# Patient Record
Sex: Male | Born: 1940 | Race: White | Hispanic: No | Marital: Married | Smoking: Former smoker
Health system: Southern US, Community
[De-identification: ages and names within clinical notes are randomized; demographics above are authoritative.]

## PROBLEM LIST (undated history)

## (undated) DIAGNOSIS — J189 Pneumonia, unspecified organism: Secondary | ICD-10-CM

## (undated) DIAGNOSIS — I071 Rheumatic tricuspid insufficiency: Secondary | ICD-10-CM

## (undated) DIAGNOSIS — M109 Gout, unspecified: Secondary | ICD-10-CM

## (undated) DIAGNOSIS — I272 Pulmonary hypertension, unspecified: Secondary | ICD-10-CM

## (undated) DIAGNOSIS — J9621 Acute and chronic respiratory failure with hypoxia: Secondary | ICD-10-CM

## (undated) DIAGNOSIS — I10 Essential (primary) hypertension: Secondary | ICD-10-CM

## (undated) DIAGNOSIS — J84112 Idiopathic pulmonary fibrosis: Secondary | ICD-10-CM

## (undated) DIAGNOSIS — I5043 Acute on chronic combined systolic (congestive) and diastolic (congestive) heart failure: Secondary | ICD-10-CM

## (undated) HISTORY — PX: LUNG LOBECTOMY: SHX167

---

## 2017-08-19 ENCOUNTER — Inpatient Hospital Stay
Admission: RE | Admit: 2017-08-19 | Discharge: 2017-10-05 | Disposition: E | Payer: Medicare Other | Attending: Internal Medicine | Admitting: Internal Medicine

## 2017-08-19 ENCOUNTER — Inpatient Hospital Stay: Admission: RE | Admit: 2017-08-19 | Payer: Self-pay | Source: Home / Self Care | Admitting: Internal Medicine

## 2017-08-19 DIAGNOSIS — J9621 Acute and chronic respiratory failure with hypoxia: Secondary | ICD-10-CM | POA: Diagnosis present

## 2017-08-19 DIAGNOSIS — R1310 Dysphagia, unspecified: Secondary | ICD-10-CM

## 2017-08-19 DIAGNOSIS — R0603 Acute respiratory distress: Secondary | ICD-10-CM

## 2017-08-19 DIAGNOSIS — J9690 Respiratory failure, unspecified, unspecified whether with hypoxia or hypercapnia: Secondary | ICD-10-CM

## 2017-08-19 DIAGNOSIS — J84112 Idiopathic pulmonary fibrosis: Secondary | ICD-10-CM | POA: Diagnosis present

## 2017-08-19 DIAGNOSIS — K222 Esophageal obstruction: Secondary | ICD-10-CM

## 2017-08-19 DIAGNOSIS — I5043 Acute on chronic combined systolic (congestive) and diastolic (congestive) heart failure: Secondary | ICD-10-CM

## 2017-08-19 DIAGNOSIS — J189 Pneumonia, unspecified organism: Secondary | ICD-10-CM | POA: Diagnosis present

## 2017-08-19 DIAGNOSIS — I509 Heart failure, unspecified: Secondary | ICD-10-CM

## 2017-08-19 DIAGNOSIS — I272 Pulmonary hypertension, unspecified: Secondary | ICD-10-CM | POA: Diagnosis present

## 2017-08-19 HISTORY — DX: Gout, unspecified: M10.9

## 2017-08-19 HISTORY — DX: Pulmonary hypertension, unspecified: I27.20

## 2017-08-19 HISTORY — DX: Essential (primary) hypertension: I10

## 2017-08-19 HISTORY — DX: Idiopathic pulmonary fibrosis: J84.112

## 2017-08-19 HISTORY — DX: Rheumatic tricuspid insufficiency: I07.1

## 2017-08-19 HISTORY — DX: Acute and chronic respiratory failure with hypoxia: J96.21

## 2017-08-19 HISTORY — DX: Pneumonia, unspecified organism: J18.9

## 2017-08-19 HISTORY — DX: Acute on chronic combined systolic (congestive) and diastolic (congestive) heart failure: I50.43

## 2017-08-20 LAB — CBC
HEMATOCRIT: 32.9 % — AB (ref 39.0–52.0)
HEMOGLOBIN: 10.3 g/dL — AB (ref 13.0–17.0)
MCH: 24.1 pg — AB (ref 26.0–34.0)
MCHC: 31.3 g/dL (ref 30.0–36.0)
MCV: 77 fL — ABNORMAL LOW (ref 78.0–100.0)
Platelets: 455 10*3/uL — ABNORMAL HIGH (ref 150–400)
RBC: 4.27 MIL/uL (ref 4.22–5.81)
RDW: 14.6 % (ref 11.5–15.5)
WBC: 13.4 10*3/uL — ABNORMAL HIGH (ref 4.0–10.5)

## 2017-08-20 LAB — BASIC METABOLIC PANEL
ANION GAP: 14 (ref 5–15)
BUN: 39 mg/dL — AB (ref 6–20)
CO2: 30 mmol/L (ref 22–32)
Calcium: 8 mg/dL — ABNORMAL LOW (ref 8.9–10.3)
Chloride: 91 mmol/L — ABNORMAL LOW (ref 101–111)
Creatinine, Ser: 1.24 mg/dL (ref 0.61–1.24)
GFR calc Af Amer: >60 mL/min (ref >60)
GFR calc non Af Amer: 54 mL/min — ABNORMAL LOW (ref >60)
GLUCOSE: 322 mg/dL — AB (ref 65–99)
Potassium: 3.4 mmol/L — ABNORMAL LOW (ref 3.5–5.1)
Sodium: 135 mmol/L (ref 135–145)

## 2017-08-20 MED ORDER — DICLOFENAC SODIUM 1 % TD GEL
TRANSDERMAL | Status: DC
Start: ? — End: 2017-08-20

## 2017-08-20 MED ORDER — PRAVASTATIN SODIUM 40 MG PO TABS
40.00 | ORAL_TABLET | ORAL | Status: DC
Start: 2017-08-20 — End: 2017-08-20

## 2017-08-20 MED ORDER — DOXAZOSIN MESYLATE 1 MG PO TABS
1.00 | ORAL_TABLET | ORAL | Status: DC
Start: 2017-08-20 — End: 2017-08-20

## 2017-08-20 MED ORDER — DOCUSATE SODIUM 100 MG PO CAPS
100.00 | ORAL_CAPSULE | ORAL | Status: DC
Start: 2017-08-19 — End: 2017-08-20

## 2017-08-20 MED ORDER — SODIUM CHLORIDE 0.9 % IV SOLN
10.00 | INTRAVENOUS | Status: DC
Start: ? — End: 2017-08-20

## 2017-08-20 MED ORDER — TAMSULOSIN HCL 0.4 MG PO CAPS
.40 | ORAL_CAPSULE | ORAL | Status: DC
Start: 2017-08-19 — End: 2017-08-20

## 2017-08-20 MED ORDER — INSULIN GLARGINE 100 UNIT/ML ~~LOC~~ SOLN
1.00 | SUBCUTANEOUS | Status: DC
Start: 2017-08-19 — End: 2017-08-20

## 2017-08-20 MED ORDER — METHYLPREDNISOLONE SODIUM SUCC 125 MG IJ SOLR
60.00 | INTRAMUSCULAR | Status: DC
Start: 2017-08-20 — End: 2017-08-20

## 2017-08-20 MED ORDER — GENERIC EXTERNAL MEDICATION
Status: DC
Start: ? — End: 2017-08-20

## 2017-08-20 MED ORDER — ALPRAZOLAM 0.25 MG PO TABS
.25 | ORAL_TABLET | ORAL | Status: DC
Start: ? — End: 2017-08-20

## 2017-08-20 MED ORDER — MAGNESIUM OXIDE 400 MG PO TABS
400.00 | ORAL_TABLET | ORAL | Status: DC
Start: 2017-08-20 — End: 2017-08-20

## 2017-08-20 MED ORDER — MELATONIN 1 MG PO TABS
2.00 | ORAL_TABLET | ORAL | Status: DC
Start: 2017-08-19 — End: 2017-08-20

## 2017-08-20 MED ORDER — ROPINIROLE HCL 0.25 MG PO TABS
0.50 | ORAL_TABLET | ORAL | Status: DC
Start: 2017-08-19 — End: 2017-08-20

## 2017-08-20 MED ORDER — IPRATROPIUM-ALBUTEROL 0.5-2.5 (3) MG/3ML IN SOLN
3.00 | RESPIRATORY_TRACT | Status: DC
Start: 2017-08-19 — End: 2017-08-20

## 2017-08-20 MED ORDER — INSULIN LISPRO 100 UNIT/ML ~~LOC~~ SOLN
1.00 | SUBCUTANEOUS | Status: DC
Start: 2017-08-19 — End: 2017-08-20

## 2017-08-20 MED ORDER — FUROSEMIDE 10 MG/ML IJ SOLN
40.00 | INTRAMUSCULAR | Status: DC
Start: 2017-08-20 — End: 2017-08-20

## 2017-08-20 MED ORDER — ACETAMINOPHEN 325 MG PO TABS
650.00 | ORAL_TABLET | ORAL | Status: DC
Start: ? — End: 2017-08-20

## 2017-08-20 MED ORDER — DILTIAZEM HCL 60 MG PO TABS
30.00 | ORAL_TABLET | ORAL | Status: DC
Start: 2017-08-19 — End: 2017-08-20

## 2017-08-20 MED ORDER — POTASSIUM CHLORIDE 20 MEQ/15ML (10%) PO SOLN
20.00 | ORAL | Status: DC
Start: ? — End: 2017-08-20

## 2017-08-20 MED ORDER — FLUDROCORTISONE ACETATE 0.1 MG PO TABS
.20 | ORAL_TABLET | ORAL | Status: DC
Start: 2017-08-20 — End: 2017-08-20

## 2017-08-20 MED ORDER — INSULIN LISPRO 100 UNIT/ML ~~LOC~~ SOLN
1.00 | SUBCUTANEOUS | Status: DC
Start: ? — End: 2017-08-20

## 2017-08-20 MED ORDER — FINASTERIDE 5 MG PO TABS
5.00 | ORAL_TABLET | ORAL | Status: DC
Start: 2017-08-20 — End: 2017-08-20

## 2017-08-20 MED ORDER — HYDRALAZINE HCL 20 MG/ML IJ SOLN
10.00 | INTRAMUSCULAR | Status: DC
Start: ? — End: 2017-08-20

## 2017-08-20 MED ORDER — HYDROCODONE-ACETAMINOPHEN 7.5-325 MG PO TABS
1.00 | ORAL_TABLET | ORAL | Status: DC
Start: ? — End: 2017-08-20

## 2017-08-20 MED ORDER — POTASSIUM CHLORIDE 20 MEQ/50ML IV SOLN
20.00 | INTRAVENOUS | Status: DC
Start: ? — End: 2017-08-20

## 2017-08-20 MED ORDER — COLCHICINE 0.6 MG PO TABS
.60 | ORAL_TABLET | ORAL | Status: DC
Start: 2017-08-20 — End: 2017-08-20

## 2017-08-20 MED ORDER — ASPIRIN EC 81 MG PO TBEC
81.00 | DELAYED_RELEASE_TABLET | ORAL | Status: DC
Start: 2017-08-20 — End: 2017-08-20

## 2017-08-20 MED ORDER — POTASSIUM CHLORIDE CRYS ER 20 MEQ PO TBCR
20.00 | EXTENDED_RELEASE_TABLET | ORAL | Status: DC
Start: ? — End: 2017-08-20

## 2017-08-20 MED ORDER — HEPARIN SODIUM (PORCINE) 5000 UNIT/ML IJ SOLN
5000.00 | INTRAMUSCULAR | Status: DC
Start: 2017-08-19 — End: 2017-08-20

## 2017-08-20 MED ORDER — SERTRALINE HCL 50 MG PO TABS
25.00 | ORAL_TABLET | ORAL | Status: DC
Start: 2017-08-20 — End: 2017-08-20

## 2017-08-21 ENCOUNTER — Other Ambulatory Visit (HOSPITAL_COMMUNITY): Payer: Medicare Other

## 2017-08-21 LAB — BASIC METABOLIC PANEL
Anion gap: 12 (ref 5–15)
BUN: 41 mg/dL — AB (ref 6–20)
CO2: 33 mmol/L — ABNORMAL HIGH (ref 22–32)
Calcium: 8.4 mg/dL — ABNORMAL LOW (ref 8.9–10.3)
Chloride: 92 mmol/L — ABNORMAL LOW (ref 101–111)
Creatinine, Ser: 1.49 mg/dL — ABNORMAL HIGH (ref 0.61–1.24)
GFR calc Af Amer: 50 mL/min — ABNORMAL LOW (ref >60)
GFR, EST NON AFRICAN AMERICAN: 44 mL/min — AB (ref >60)
GLUCOSE: 102 mg/dL — AB (ref 65–99)
POTASSIUM: 3.2 mmol/L — AB (ref 3.5–5.1)
SODIUM: 137 mmol/L (ref 135–145)

## 2017-08-21 LAB — MAGNESIUM: Magnesium: 2.5 mg/dL — ABNORMAL HIGH (ref 1.7–2.4)

## 2017-08-21 LAB — HEMOGLOBIN A1C
Hgb A1c MFr Bld: 7.4 % — ABNORMAL HIGH (ref 4.8–5.6)
MEAN PLASMA GLUCOSE: 165.68 mg/dL

## 2017-08-21 MED ORDER — GENERIC EXTERNAL MEDICATION
Status: DC
Start: ? — End: 2017-08-21

## 2017-08-22 LAB — RENAL FUNCTION PANEL
Albumin: 2.7 g/dL — ABNORMAL LOW (ref 3.5–5.0)
Anion gap: 14 (ref 5–15)
BUN: 42 mg/dL — AB (ref 6–20)
CALCIUM: 8.3 mg/dL — AB (ref 8.9–10.3)
CHLORIDE: 91 mmol/L — AB (ref 101–111)
CO2: 32 mmol/L (ref 22–32)
CREATININE: 1.36 mg/dL — AB (ref 0.61–1.24)
GFR, EST AFRICAN AMERICAN: 56 mL/min — AB (ref >60)
GFR, EST NON AFRICAN AMERICAN: 49 mL/min — AB (ref >60)
Glucose, Bld: 192 mg/dL — ABNORMAL HIGH (ref 65–99)
Phosphorus: 5.3 mg/dL — ABNORMAL HIGH (ref 2.5–4.6)
Potassium: 3.6 mmol/L (ref 3.5–5.1)
Sodium: 137 mmol/L (ref 135–145)

## 2017-08-22 LAB — CBC
HEMATOCRIT: 33.6 % — AB (ref 39.0–52.0)
HEMOGLOBIN: 10.4 g/dL — AB (ref 13.0–17.0)
MCH: 24 pg — AB (ref 26.0–34.0)
MCHC: 31 g/dL (ref 30.0–36.0)
MCV: 77.6 fL — AB (ref 78.0–100.0)
Platelets: 498 10*3/uL — ABNORMAL HIGH (ref 150–400)
RBC: 4.33 MIL/uL (ref 4.22–5.81)
RDW: 15 % (ref 11.5–15.5)
WBC: 15.5 10*3/uL — ABNORMAL HIGH (ref 4.0–10.5)

## 2017-08-22 LAB — MAGNESIUM: MAGNESIUM: 2.6 mg/dL — AB (ref 1.7–2.4)

## 2017-08-24 LAB — RENAL FUNCTION PANEL
ALBUMIN: 2.5 g/dL — AB (ref 3.5–5.0)
ANION GAP: 16 — AB (ref 5–15)
BUN: 42 mg/dL — ABNORMAL HIGH (ref 6–20)
CHLORIDE: 94 mmol/L — AB (ref 101–111)
CO2: 26 mmol/L (ref 22–32)
Calcium: 7.9 mg/dL — ABNORMAL LOW (ref 8.9–10.3)
Creatinine, Ser: 1.31 mg/dL — ABNORMAL HIGH (ref 0.61–1.24)
GFR, EST AFRICAN AMERICAN: 59 mL/min — AB (ref >60)
GFR, EST NON AFRICAN AMERICAN: 51 mL/min — AB (ref >60)
Glucose, Bld: 76 mg/dL (ref 65–99)
PHOSPHORUS: 4.7 mg/dL — AB (ref 2.5–4.6)
POTASSIUM: 3.3 mmol/L — AB (ref 3.5–5.1)
Sodium: 136 mmol/L (ref 135–145)

## 2017-08-24 LAB — CBC
HEMATOCRIT: 31.4 % — AB (ref 39.0–52.0)
HEMOGLOBIN: 9.8 g/dL — AB (ref 13.0–17.0)
MCH: 24 pg — ABNORMAL LOW (ref 26.0–34.0)
MCHC: 31.2 g/dL (ref 30.0–36.0)
MCV: 76.8 fL — AB (ref 78.0–100.0)
Platelets: 372 10*3/uL (ref 150–400)
RBC: 4.09 MIL/uL — AB (ref 4.22–5.81)
RDW: 15 % (ref 11.5–15.5)
WBC: 15 10*3/uL — AB (ref 4.0–10.5)

## 2017-08-24 LAB — MAGNESIUM: Magnesium: 2.6 mg/dL — ABNORMAL HIGH (ref 1.7–2.4)

## 2017-08-25 LAB — BASIC METABOLIC PANEL
Anion gap: 12 (ref 5–15)
BUN: 37 mg/dL — ABNORMAL HIGH (ref 6–20)
CHLORIDE: 93 mmol/L — AB (ref 101–111)
CO2: 30 mmol/L (ref 22–32)
CREATININE: 1.46 mg/dL — AB (ref 0.61–1.24)
Calcium: 8.1 mg/dL — ABNORMAL LOW (ref 8.9–10.3)
GFR calc non Af Amer: 45 mL/min — ABNORMAL LOW (ref >60)
GFR, EST AFRICAN AMERICAN: 52 mL/min — AB (ref >60)
Glucose, Bld: 257 mg/dL — ABNORMAL HIGH (ref 65–99)
POTASSIUM: 3.1 mmol/L — AB (ref 3.5–5.1)
Sodium: 135 mmol/L (ref 135–145)

## 2017-08-26 LAB — BASIC METABOLIC PANEL
Anion gap: 10 (ref 5–15)
BUN: 28 mg/dL — ABNORMAL HIGH (ref 6–20)
CO2: 32 mmol/L (ref 22–32)
Calcium: 7.9 mg/dL — ABNORMAL LOW (ref 8.9–10.3)
Chloride: 97 mmol/L — ABNORMAL LOW (ref 101–111)
Creatinine, Ser: 1.08 mg/dL (ref 0.61–1.24)
GFR calc Af Amer: >60 mL/min (ref >60)
Glucose, Bld: 96 mg/dL (ref 65–99)
Potassium: 3.3 mmol/L — ABNORMAL LOW (ref 3.5–5.1)
SODIUM: 139 mmol/L (ref 135–145)

## 2017-08-27 ENCOUNTER — Encounter: Payer: Self-pay | Admitting: Internal Medicine

## 2017-08-27 ENCOUNTER — Other Ambulatory Visit (HOSPITAL_COMMUNITY): Payer: Medicare Other

## 2017-08-27 DIAGNOSIS — J84112 Idiopathic pulmonary fibrosis: Secondary | ICD-10-CM | POA: Diagnosis present

## 2017-08-27 DIAGNOSIS — I509 Heart failure, unspecified: Secondary | ICD-10-CM | POA: Diagnosis not present

## 2017-08-27 DIAGNOSIS — J189 Pneumonia, unspecified organism: Secondary | ICD-10-CM | POA: Diagnosis present

## 2017-08-27 DIAGNOSIS — J9621 Acute and chronic respiratory failure with hypoxia: Secondary | ICD-10-CM | POA: Diagnosis not present

## 2017-08-27 DIAGNOSIS — I272 Pulmonary hypertension, unspecified: Secondary | ICD-10-CM | POA: Diagnosis not present

## 2017-08-27 DIAGNOSIS — I5043 Acute on chronic combined systolic (congestive) and diastolic (congestive) heart failure: Secondary | ICD-10-CM

## 2017-08-27 HISTORY — DX: Acute on chronic combined systolic (congestive) and diastolic (congestive) heart failure: I50.43

## 2017-08-27 LAB — POTASSIUM: Potassium: 3.4 mmol/L — ABNORMAL LOW (ref 3.5–5.1)

## 2017-08-27 MED ORDER — GENERIC EXTERNAL MEDICATION
Status: DC
Start: ? — End: 2017-08-27

## 2017-08-27 NOTE — Progress Notes (Signed)
Pulmonary Critical Care Medicine [facility] [place]  PULMONARY SERVICE  Date of Service: 08/27/2017  PULMONARY CONSULT   Philip Knobel Custis Sr.  ZOX:[id number]  DOB: 1940/10/24   DOA: 08/30/2017  Referring Physician: Carron Curie, MD  HPI: Philip Migliaccio Corprew Sr. is a 77 y.o. male seen for follow up of Acute on Chronic Respiratory Failure.  Patient was admitted to the transferring facility with history of shortness of breath.  Patient came in with a prior history of hyperlipidemia strokes COPD diabetes type 2 atrial fibrillation lung cancer with increasing shortness of breath.  His hospital course was as follows.  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  Eventually her antibiotics were changed over to Zosyn by itself.  On initial evaluation patient was noted to have pleural effusions and pulmonary edema.  Patient was diuresed aggressively improvement.  His oxygen requirements however have not gone totally down.  Currently patient has been requiring flow nasal cannula about 60% FiO2 with 25 L flow.  Latest chest x-ray still shows edema versus pneumonia at this time.  The wife was present in the room states that he typically does well after Lasix is given but then the fluid re-accumulates fairly rapidly.  Review of Systems:  ROS performed and is unremarkable other than noted above.  Past Medical History:  Diagnosis Date  . Acute on chronic respiratory failure with hypoxia (HCC)   . Essential hypertension   . Gout   . Healthcare-associated pneumonia   . Idiopathic diffuse interstitial pulmonary fibrosis (HCC)   . Pulmonary hypertension (HCC)   . Tricuspid regurgitation     Past Surgical History:  Procedure Laterality Date  . LUNG LOBECTOMY      Social History:    reports that he has quit smoking. He has  never used smokeless tobacco. He reports that he drank alcohol. He reports that he has current or past drug history.  Family History: Non-Contributory to the present illness  Allergies not on file  Medications: Reviewed on Rounds  Physical Exam:  Vitals: Temperature 98.7 pulse 95 respiratory rate 13 blood pressure 111/57 saturations 97%  Ventilator Settings off the ventilator on high flow nasal cannula 60% FiO2  . General: Comfortable at this time . Eyes: Grossly normal lids, irises & conjunctiva . ENT: grossly tongue is normal . Neck: no obvious mass . Cardiovascular: S1-S2 normal no gallop or rub . Respiratory: Coarse breath sounds expansion is equal . Abdomen: Soft and nontender . Skin: no rash seen on limited exam . Musculoskeletal: not rigid . Psychiatric:unable to assess . Neurologic: no seizure no involuntary movements         Labs on Admission:  Basic Metabolic Panel: Recent Labs  Lab 08/21/17 0642 08/22/17 0651 08/24/17 0644 08/25/17 0604 08/26/17 0648 08/27/17 0516  NA 137 137 136 135 139  --   K 3.2* 3.6 3.3* 3.1* 3.3* 3.4*  CL 92* 91* 94* 93* 97*  --   CO2 33* 32 26 30 32  --   GLUCOSE 102* 192* 76 257* 96  --   BUN 41* 42* 42* 37* 28*  --   CREATININE 1.49* 1.36* 1.31* 1.46* 1.08  --   CALCIUM 8.4* 8.3* 7.9* 8.1* 7.9*  --   MG 2.5* 2.6* 2.6*  --   --   --   PHOS  --  5.3* 4.7*  --   --   --     Liver Function Tests: Recent Labs  Lab 08/22/17 0651 08/24/17 0644  ALBUMIN 2.7* 2.5*   No results for input(s): LIPASE, AMYLASE in the last 168 hours. No results for input(s): AMMONIA in the last 168 hours.  CBC: Recent Labs  Lab 08/22/17 0651 08/24/17 0644  WBC 15.5* 15.0*  HGB 10.4* 9.8*  HCT 33.6* 31.4*  MCV 77.6* 76.8*  PLT 498* 372    Cardiac Enzymes: No results for input(s): CKTOTAL, CKMB, CKMBINDEX, TROPONINI in the last 168 hours.  BNP (last 3 results) No results for input(s): BNP in the last 8760 hours.  ProBNP (last 3  results) No results for input(s): PROBNP in the last 8760 hours.   Radiological Exams on Admission: Dg Chest Port 1 View  Result Date: 08/27/2017 CLINICAL DATA:  Shortness of breath. EXAM: PORTABLE CHEST 1 VIEW COMPARISON:  Radiograph of Aug 21, 2017. FINDINGS: Stable cardiomediastinal silhouette. No pneumothorax or pleural effusion is noted. Stable bibasilar opacities are noted concerning for pneumonia or edema. Minimal pleural effusions may be present. Bony thorax is unremarkable. Right apical density is noted which may represent scarring, but possible mass cannot be excluded. Further evaluation with apical lordotic view or CT scan of the chest with contrast is recommended. Bony thorax is unremarkable. IMPRESSION: Stable bibasilar opacities are noted concerning for edema or possibly pneumonia. Minimal pleural effusions may be present. Right apical density is noted which may represent scarring or pleural thickening, but possible mass or neoplasm cannot be excluded. Further evaluation with apical lordotic view of the chest or CT scan of the chest with intravenous contrast is recommended. Electronically Signed   By: Lupita Raider, M.D.   On: 08/27/2017 10:36    Assessment/Plan Active Problems:   Acute on chronic respiratory failure with hypoxia (HCC)   Idiopathic diffuse interstitial pulmonary fibrosis (HCC)   Pulmonary hypertension (HCC)   Healthcare-associated pneumonia   Acute on chronic combined systolic and diastolic CHF (congestive heart failure) (HCC)   1. Acute on chronic respiratory failure with hypoxia on high flow nasal cannula currently is on 60% with saturations of 97%.  Suggested to respiratory therapy to try to wean the FiO2 down.  In addition I think patient will benefit from diuresis.  Suggested starting on Lasix. 2. Diffuse interstitial pulmonary fibrosis patient obviously has advanced disease we will continue with supportive care.  Pulmonary pulmonary disease not likely to  improve. 3. Pulmonary hypertension patient has significant elevation of PA pressures we will continue with supportive care 4. Healthcare associated pneumonia we will continue with present management has been treated with antibiotics. 5. Acute on chronic systolic diastolic heart failure we will diurese as mentioned already.  I have personally seen and evaluated the patient, evaluated laboratory and imaging results, formulated the assessment and plan and placed orders. The Patient requires high complexity decision making for assessment and support.  Case was discussed on Rounds with the Respiratory Therapy Staff Time Spent  Yevonne Pax, MD [facility] Pulmonary Critical Care Medicine Sleep Medicine

## 2017-08-28 DIAGNOSIS — J189 Pneumonia, unspecified organism: Secondary | ICD-10-CM | POA: Diagnosis not present

## 2017-08-28 DIAGNOSIS — I509 Heart failure, unspecified: Secondary | ICD-10-CM

## 2017-08-28 DIAGNOSIS — I5043 Acute on chronic combined systolic (congestive) and diastolic (congestive) heart failure: Secondary | ICD-10-CM | POA: Diagnosis not present

## 2017-08-28 DIAGNOSIS — J9621 Acute and chronic respiratory failure with hypoxia: Secondary | ICD-10-CM | POA: Diagnosis not present

## 2017-08-28 NOTE — Progress Notes (Signed)
Pulmonary Critical Care Medicine [facility] [place]   PULMONARY SERVICE  PROGRESS NOTE  Date of Service: 08/28/2017  Philip Reifsteck Haberland Sr.  RUE:[id number]  DOB: 01-Nov-1940   DOA: 2017/09/04  Referring Physician: Carron Curie, MD  HPI: Philip Brickell Nygaard Sr. is a 77 y.o. male seen for follow up of Acute on Chronic Respiratory Failure.  Remains on high flow nasal cannula.  As mentioned patient has had a very complicated course with pulmonary hypertension pulmonary fibrosis lumpectomy and now is on oxygen dependence with high flow rates.  Medications: Reviewed on Rounds  Physical Exam:  Vitals: Temperature 97.5 pulse 89 respiratory rate 21 blood pressure is 124/63 saturations 98%  Ventilator Settings off of the ventilator on high flow nasal cannula with 60% FiO2 25 L/min flow  . General: Comfortable at this time . Eyes: Grossly normal lids, irises & conjunctiva . ENT: grossly tongue is normal . Neck: no obvious mass . Cardiovascular: S1 S2 normal no gallop . Respiratory: Coarse rhonchi expansion is equal . Abdomen: soft . Skin: no rash seen on limited exam . Musculoskeletal: not rigid . Psychiatric:unable to assess . Neurologic: no seizure no involuntary movements         Labs on Admission:  Basic Metabolic Panel: Recent Labs  Lab 08/22/17 0651 08/24/17 0644 08/25/17 0604 08/26/17 0648 08/27/17 0516  NA 137 136 135 139  --   K 3.6 3.3* 3.1* 3.3* 3.4*  CL 91* 94* 93* 97*  --   CO2 32 26 30 32  --   GLUCOSE 192* 76 257* 96  --   BUN 42* 42* 37* 28*  --   CREATININE 1.36* 1.31* 1.46* 1.08  --   CALCIUM 8.3* 7.9* 8.1* 7.9*  --   MG 2.6* 2.6*  --   --   --   PHOS 5.3* 4.7*  --   --   --     Liver Function Tests: Recent Labs  Lab 08/22/17 0651 08/24/17 0644  ALBUMIN 2.7* 2.5*   No results for input(s): LIPASE, AMYLASE in the last 168 hours. No results for input(s): AMMONIA in the last 168 hours.  CBC: Recent Labs  Lab 08/22/17 0651 08/24/17 0644  WBC  15.5* 15.0*  HGB 10.4* 9.8*  HCT 33.6* 31.4*  MCV 77.6* 76.8*  PLT 498* 372    Cardiac Enzymes: No results for input(s): CKTOTAL, CKMB, CKMBINDEX, TROPONINI in the last 168 hours.  BNP (last 3 results) No results for input(s): BNP in the last 8760 hours.  ProBNP (last 3 results) No results for input(s): PROBNP in the last 8760 hours.  Radiological Exams on Admission: Dg Chest Port 1 View  Result Date: 08/27/2017 CLINICAL DATA:  Shortness of breath. EXAM: PORTABLE CHEST 1 VIEW COMPARISON:  Radiograph of Aug 21, 2017. FINDINGS: Stable cardiomediastinal silhouette. No pneumothorax or pleural effusion is noted. Stable bibasilar opacities are noted concerning for pneumonia or edema. Minimal pleural effusions may be present. Bony thorax is unremarkable. Right apical density is noted which may represent scarring, but possible mass cannot be excluded. Further evaluation with apical lordotic view or CT scan of the chest with contrast is recommended. Bony thorax is unremarkable. IMPRESSION: Stable bibasilar opacities are noted concerning for edema or possibly pneumonia. Minimal pleural effusions may be present. Right apical density is noted which may represent scarring or pleural thickening, but possible mass or neoplasm cannot be excluded. Further evaluation with apical lordotic view of the chest or CT scan of the chest with intravenous contrast  is recommended. Electronically Signed   By: Lupita Raider, M.D.   On: 08/27/2017 10:36    Assessment/Plan Active Problems:   Acute on chronic respiratory failure with hypoxia (HCC)   Idiopathic diffuse interstitial pulmonary fibrosis (HCC)   Pulmonary hypertension (HCC)   Healthcare-associated pneumonia   Acute on chronic combined systolic and diastolic CHF (congestive heart failure) (HCC)   1. Acute on chronic respiratory failure with hypoxia we will continue with oxygen try to wean FiO2 down if able to tolerate.  Continue pulmonary toilet secretion  management supportive care prognosis is poor 2. Idiopathic pulmonary fibrosis diffuse advanced disease we will continue with supportive care patient prognosis of poor 3. Pulmonary hypertension at baseline we will continue oxygen therapy 4. Healthcare associated pneumonia treated we will continue to follow 5. Acute on chronic combined systolic diastolic heart failure diuresis tolerated we will continue to follow   I have personally seen and evaluated the patient, evaluated laboratory and imaging results, formulated the assessment and plan and placed orders. The Patient requires high complexity decision making for assessment and support.  Case was discussed on Rounds with the Respiratory Therapy Staff  Yevonne Pax, MD [facility] Pulmonary Critical Care Medicine Sleep Medicine

## 2017-08-29 LAB — CBC
HEMATOCRIT: 32.9 % — AB (ref 39.0–52.0)
Hemoglobin: 10.1 g/dL — ABNORMAL LOW (ref 13.0–17.0)
MCH: 24.2 pg — ABNORMAL LOW (ref 26.0–34.0)
MCHC: 30.7 g/dL (ref 30.0–36.0)
MCV: 78.9 fL (ref 78.0–100.0)
Platelets: 229 10*3/uL (ref 150–400)
RBC: 4.17 MIL/uL — ABNORMAL LOW (ref 4.22–5.81)
RDW: 16.3 % — AB (ref 11.5–15.5)
WBC: 15.8 10*3/uL — AB (ref 4.0–10.5)

## 2017-08-29 LAB — BASIC METABOLIC PANEL
ANION GAP: 13 (ref 5–15)
BUN: 32 mg/dL — ABNORMAL HIGH (ref 6–20)
CALCIUM: 8.4 mg/dL — AB (ref 8.9–10.3)
CO2: 33 mmol/L — ABNORMAL HIGH (ref 22–32)
Chloride: 94 mmol/L — ABNORMAL LOW (ref 101–111)
Creatinine, Ser: 1.22 mg/dL (ref 0.61–1.24)
GFR, EST NON AFRICAN AMERICAN: 55 mL/min — AB (ref >60)
Glucose, Bld: 104 mg/dL — ABNORMAL HIGH (ref 65–99)
Potassium: 3.4 mmol/L — ABNORMAL LOW (ref 3.5–5.1)
Sodium: 140 mmol/L (ref 135–145)

## 2017-08-30 LAB — BASIC METABOLIC PANEL
Anion gap: 8 (ref 5–15)
BUN: 28 mg/dL — ABNORMAL HIGH (ref 6–20)
CALCIUM: 8 mg/dL — AB (ref 8.9–10.3)
CO2: 36 mmol/L — ABNORMAL HIGH (ref 22–32)
CREATININE: 1.09 mg/dL (ref 0.61–1.24)
Chloride: 91 mmol/L — ABNORMAL LOW (ref 101–111)
GFR calc non Af Amer: >60 mL/min (ref >60)
Glucose, Bld: 117 mg/dL — ABNORMAL HIGH (ref 65–99)
Potassium: 3.7 mmol/L (ref 3.5–5.1)
SODIUM: 135 mmol/L (ref 135–145)

## 2017-08-31 DIAGNOSIS — I509 Heart failure, unspecified: Secondary | ICD-10-CM | POA: Diagnosis not present

## 2017-08-31 DIAGNOSIS — J9621 Acute and chronic respiratory failure with hypoxia: Secondary | ICD-10-CM | POA: Diagnosis not present

## 2017-08-31 DIAGNOSIS — I5043 Acute on chronic combined systolic (congestive) and diastolic (congestive) heart failure: Secondary | ICD-10-CM | POA: Diagnosis not present

## 2017-08-31 DIAGNOSIS — J189 Pneumonia, unspecified organism: Secondary | ICD-10-CM | POA: Diagnosis not present

## 2017-08-31 NOTE — Progress Notes (Signed)
Pulmonary Critical Care Medicine [facility] [place]   PULMONARY SERVICE  PROGRESS NOTE  Date of Service: 08/31/2017  Philip Helfman Mckeon Sr.  ZOX:[id number]  DOB: [birth year]   DOA: 08/10/2017  Referring Physician: Carron Curie, MD  HPI: Philip Castell Manthei Sr. is a 77 y.o. male seen for follow up of Acute on Chronic Respiratory Failure.  Patient is on oxygen high flow nasal cannula with 50% oxygen and 20 L flow rate  Medications: Reviewed on Rounds  Physical Exam:  Vitals: Temperature 96.4 pulse 66 respiratory rate 14 blood pressure 146/70 saturations 100%  Ventilator Settings off the ventilator  . General: Comfortable at this time . Eyes: Grossly normal lids, irises & conjunctiva . ENT: grossly tongue is normal . Neck: no obvious mass . Cardiovascular: S1 S2 normal no gallop . Respiratory: No rhonchi expansion equal . Abdomen: soft . Skin: no rash seen on limited exam . Musculoskeletal: not rigid . Psychiatric:unable to assess . Neurologic: no seizure no involuntary movements         Labs on Admission:  Basic Metabolic Panel: Recent Labs  Lab 08/25/17 0604 08/26/17 0648 08/27/17 0516 08/29/17 0437 08/30/17 0515  NA 135 139  --  140 135  K 3.1* 3.3* 3.4* 3.4* 3.7  CL 93* 97*  --  94* 91*  CO2 30 32  --  33* 36*  GLUCOSE 257* 96  --  104* 117*  BUN 37* 28*  --  32* 28*  CREATININE 1.46* 1.08  --  1.22 1.09  CALCIUM 8.1* 7.9*  --  8.4* 8.0*    Liver Function Tests: No results for input(s): AST, ALT, ALKPHOS, BILITOT, PROT, ALBUMIN in the last 168 hours. No results for input(s): LIPASE, AMYLASE in the last 168 hours. No results for input(s): AMMONIA in the last 168 hours.  CBC: Recent Labs  Lab 08/29/17 0437  WBC 15.8*  HGB 10.1*  HCT 32.9*  MCV 78.9  PLT 229    Cardiac Enzymes: No results for input(s): CKTOTAL, CKMB, CKMBINDEX, TROPONINI in the last 168 hours.  BNP (last 3 results) No results for input(s): BNP in the last 8760 hours.  ProBNP  (last 3 results) No results for input(s): PROBNP in the last 8760 hours.  Radiological Exams on Admission: No results found.  Assessment/Plan Active Problems:   Acute on chronic respiratory failure with hypoxia (HCC)   Idiopathic diffuse interstitial pulmonary fibrosis (HCC)   Pulmonary hypertension (HCC)   Healthcare-associated pneumonia   Acute on chronic combined systolic and diastolic CHF (congestive heart failure) (HCC)   1. Acute on chronic respiratory failure with hypoxia we will try to continue to wean FiO2 as tolerated as previously mentioned his prognosis is quite guarded.  Patient has high oxygen requirements and diffuse interstitial lung disease along with pulmonary hypertension. 2. Idiopathic diffuse interstitial fibrosis advanced severe disease we will continue supportive care and follow continue with pulmonary toilet and monitor. 3. Pulmonary hypertension at baseline we will continue present management 4. Healthcare associated pneumonia treated with antibiotics we will continue to follow 5. Acute on chronic combined systolic diastolic heart failure at baseline we will continue supportive care   I have personally seen and evaluated the patient, evaluated laboratory and imaging results, formulated the assessment and plan and placed orders. The Patient requires high complexity decision making for assessment and support.  Case was discussed on Rounds with the Respiratory Therapy Staff  Yevonne Pax, MD [facility] Pulmonary Critical Care Medicine Sleep Medicine

## 2017-09-01 ENCOUNTER — Other Ambulatory Visit (HOSPITAL_COMMUNITY): Payer: Medicare Other

## 2017-09-01 DIAGNOSIS — I5043 Acute on chronic combined systolic (congestive) and diastolic (congestive) heart failure: Secondary | ICD-10-CM | POA: Diagnosis not present

## 2017-09-01 DIAGNOSIS — J9621 Acute and chronic respiratory failure with hypoxia: Secondary | ICD-10-CM | POA: Diagnosis not present

## 2017-09-01 DIAGNOSIS — J189 Pneumonia, unspecified organism: Secondary | ICD-10-CM | POA: Diagnosis not present

## 2017-09-01 DIAGNOSIS — I509 Heart failure, unspecified: Secondary | ICD-10-CM | POA: Diagnosis not present

## 2017-09-01 LAB — CBC
HEMATOCRIT: 31.8 % — AB (ref 39.0–52.0)
HEMOGLOBIN: 9.9 g/dL — AB (ref 13.0–17.0)
MCH: 24.6 pg — ABNORMAL LOW (ref 26.0–34.0)
MCHC: 31.1 g/dL (ref 30.0–36.0)
MCV: 78.9 fL (ref 78.0–100.0)
Platelets: 88 10*3/uL — ABNORMAL LOW (ref 150–400)
RBC: 4.03 MIL/uL — ABNORMAL LOW (ref 4.22–5.81)
RDW: 16.4 % — ABNORMAL HIGH (ref 11.5–15.5)
WBC: 13.1 10*3/uL — ABNORMAL HIGH (ref 4.0–10.5)

## 2017-09-01 LAB — RENAL FUNCTION PANEL
ALBUMIN: 2.6 g/dL — AB (ref 3.5–5.0)
ANION GAP: 10 (ref 5–15)
BUN: 26 mg/dL — ABNORMAL HIGH (ref 6–20)
CO2: 35 mmol/L — ABNORMAL HIGH (ref 22–32)
Calcium: 8.2 mg/dL — ABNORMAL LOW (ref 8.9–10.3)
Chloride: 89 mmol/L — ABNORMAL LOW (ref 101–111)
Creatinine, Ser: 1.01 mg/dL (ref 0.61–1.24)
GFR calc non Af Amer: >60 mL/min (ref >60)
GLUCOSE: 121 mg/dL — AB (ref 65–99)
PHOSPHORUS: 4.5 mg/dL (ref 2.5–4.6)
POTASSIUM: 4 mmol/L (ref 3.5–5.1)
Sodium: 134 mmol/L — ABNORMAL LOW (ref 135–145)

## 2017-09-01 LAB — MAGNESIUM: Magnesium: 2.3 mg/dL (ref 1.7–2.4)

## 2017-09-01 MED ORDER — GENERIC EXTERNAL MEDICATION
Status: DC
Start: ? — End: 2017-09-01

## 2017-09-01 NOTE — Progress Notes (Signed)
Pulmonary Critical Care Medicine [facility] [place]   PULMONARY SERVICE  PROGRESS NOTE  Date of Service: 09/01/2017  Philip Weisenburger Philip Sr.  NWG:[id number]  DOB: 1940/05/22   DOA: 08/14/2017  Referring Physician: Carron Curie, MD  HPI: Philip Hausen Sava Sr. is a 77 y.o. male seen for follow up of Acute on Chronic Respiratory Failure.  Comfortable without distress patient's been on 6 L Oxymizer  Medications: Reviewed on Rounds  Physical Exam:  Vitals: Temperature 98.0 pulse 77 respiratory rate 16 blood pressure 135/96 saturations 100%  Ventilator Settings currently off the ventilator  . General: Comfortable at this time . Eyes: Grossly normal lids, irises & conjunctiva . ENT: grossly tongue is normal . Neck: no obvious mass . Cardiovascular: S1 S2 normal no gallop . Respiratory: Good air entry no rhonchi . Abdomen: soft . Skin: no rash seen on limited exam . Musculoskeletal: not rigid . Psychiatric:unable to assess . Neurologic: no seizure no involuntary movements         Labs on Admission:  Basic Metabolic Panel: Recent Labs  Lab 08/26/17 0648 08/27/17 0516 08/29/17 0437 08/30/17 0515 09/01/17 0822  NA 139  --  140 135 134*  K 3.3* 3.4* 3.4* 3.7 4.0  CL 97*  --  94* 91* 89*  CO2 32  --  33* 36* 35*  GLUCOSE 96  --  104* 117* 121*  BUN 28*  --  32* 28* 26*  CREATININE 1.08  --  1.22 1.09 1.01  CALCIUM 7.9*  --  8.4* 8.0* 8.2*  MG  --   --   --   --  2.3  PHOS  --   --   --   --  4.5    Liver Function Tests: Recent Labs  Lab 09/01/17 0822  ALBUMIN 2.6*   No results for input(s): LIPASE, AMYLASE in the last 168 hours. No results for input(s): AMMONIA in the last 168 hours.  CBC: Recent Labs  Lab 08/29/17 0437 09/01/17 0822  WBC 15.8* 13.1*  HGB 10.1* 9.9*  HCT 32.9* 31.8*  MCV 78.9 78.9  PLT 229 88*    Cardiac Enzymes: No results for input(s): CKTOTAL, CKMB, CKMBINDEX, TROPONINI in the last 168 hours.  BNP (last 3 results) No results  for input(s): BNP in the last 8760 hours.  ProBNP (last 3 results) No results for input(s): PROBNP in the last 8760 hours.  Radiological Exams on Admission: Dg Esophagus  Result Date: 09/01/2017 CLINICAL DATA:  Dysphagia and regurgitation. EXAM: ESOPHOGRAM/BARIUM SWALLOW TECHNIQUE: Single contrast examination was performed using  thin barium. FLUOROSCOPY TIME:  Fluoroscopy Time:  1 minutes and 41 seconds Radiation Exposure Index (if provided by the fluoroscopic device): Number of Acquired Spot Images: 0 COMPARISON:  None. FINDINGS: Esophageal dysmotility with disruption of the primary peristaltic wave, frequent tertiary contractions, esophageal spasm and moderate esophageal stasis. No definite hiatal hernia or GE reflux. Persistent area of smooth strictured narrowing just above the esophageal vestibule. The 13 mm barium pill would not pass through this area. Recommend endoscopic evaluation. IMPRESSION: Area of smooth strictured narrowing just above the esophageal vestibule. The 13 mm barium pill would not pass through this area. Esophageal dysmotility. Electronically Signed   By: Rudie Meyer M.D.   On: 09/01/2017 15:48    Assessment/Plan Active Problems:   Acute on chronic respiratory failure with hypoxia (HCC)   Idiopathic diffuse interstitial pulmonary fibrosis (HCC)   Pulmonary hypertension (HCC)   Healthcare-associated pneumonia   Acute on chronic combined systolic  and diastolic CHF (congestive heart failure) (HCC)   1. Acute on chronic respiratory failure with hypoxia we will continue with present management on Oxymizer wean oxygen down as tolerated continue pulmonary toilet supportive care 2. Diffuse pulmonary fibrosis at baseline prognosis poor 3. Pulmonary hypertension we will continue present management 4. Healthcare associated pneumonia treated with antibiotics 5. Acute on chronic systolic and diastolic heart failure stable at this time   I have personally seen and evaluated  the patient, evaluated laboratory and imaging results, formulated the assessment and plan and placed orders. The Patient requires high complexity decision making for assessment and support.  Case was discussed on Rounds with the Respiratory Therapy Staff  Yevonne Pax, MD [facility] Pulmonary Critical Care Medicine Sleep Medicine

## 2017-09-02 DIAGNOSIS — J9621 Acute and chronic respiratory failure with hypoxia: Secondary | ICD-10-CM | POA: Diagnosis not present

## 2017-09-02 DIAGNOSIS — I5043 Acute on chronic combined systolic (congestive) and diastolic (congestive) heart failure: Secondary | ICD-10-CM | POA: Diagnosis not present

## 2017-09-02 DIAGNOSIS — J189 Pneumonia, unspecified organism: Secondary | ICD-10-CM | POA: Diagnosis not present

## 2017-09-02 DIAGNOSIS — I509 Heart failure, unspecified: Secondary | ICD-10-CM | POA: Diagnosis not present

## 2017-09-02 NOTE — Consult Note (Signed)
[facility] Consultation Note  Referring Provider: Dr. Sharyon Medicus ([facility]) Primary Care Physician:  No primary care provider on file.  Reason for Consultation:  dysphagia  HPI: Philip Gandolfo Stainback Sr. is a 77 y.o. male with numerous medical problems here in [facility] for rehab for his acute on chronic respiratory failure, nosocomial pneumonia on top of severe baseline pulmonary fibrosis and COPD.  Has chronic dysphagia liquids>solids for years, reports GERD, reports prior endoscopic dilatation of esophagus within the past few years.  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  No abdominal pain or blood in stool.  Patient on full liquids, but tells me he has been able to swallow food fine, so long as he takes small bites and eats slowly.   Past Medical History:  Diagnosis Date  . Acute on chronic combined systolic and diastolic CHF (congestive heart failure) (HCC) 08/27/2017  . Acute on chronic respiratory failure with hypoxia (HCC)   . Essential hypertension   . Gout   . Healthcare-associated pneumonia   . Idiopathic diffuse interstitial pulmonary fibrosis (HCC)   . Pulmonary hypertension (HCC)   . Tricuspid regurgitation     Past Surgical History:  Procedure Laterality Date  . LUNG LOBECTOMY      Prior to Admission medications   Not on File    No current facility-administered medications for this encounter.     Allergies as of 08/10/2017  . (Not on File)    Family History  Family history unknown: Yes    Social History   Socioeconomic History  . Marital status: Married    Spouse name: Not on file  . Number of children: Not on file  . Years of education: Not on file  . Highest education level: Not on file  Occupational History  . Not on file  Social Needs  . Financial resource strain:  Not on file  . Food insecurity:    Worry: Not on file    Inability: Not on file  . Transportation needs:    Medical: Not on file    Non-medical: Not on file  Tobacco Use  . Smoking status: Former Games developer  . Smokeless tobacco: Never Used  Substance and Sexual Activity  . Alcohol use: Not Currently  . Drug use: Not Currently  . Sexual activity: Not Currently  Lifestyle  . Physical activity:    Days per week: Not on file    Minutes per session: Not on file  . Stress: Not on file  Relationships  . Social connections:    Talks on phone: Not on file    Gets together: Not on file    Attends religious service: Not on file    Active member of club or organization: Not on file    Attends meetings of clubs or organizations: Not on file    Relationship status: Not on file  . Intimate partner violence:    Fear of current or ex partner: Not on file    Emotionally abused: Not on file    Physically abused: Not on file    Forced sexual activity: Not on file  Other Topics Concern  . Not on file  Social History Narrative  . Not on file    Review of Systems: As per HPI, all others negative  Physical Exam: Vital signs in last 24 hours: BP: ()/()  Arterial Line BP: ()/()  General:   Alert,  Well-developed, well-nourished, frustrated, tachypneic Head:  Normocephalic and atraumatic. Eyes:  Sclera clear, no icterus.   Conjunctiva pink. Ears:  Normal auditory acuity. Nose:  No deformity, discharge,  or lesions. Mouth:  No deformity or lesions.  Oropharynx pink & moist. Neck:  Supple; + JVD; no masses or thyromegaly. Lungs:  Poor breath sounds throughout; bibasilar crackles seen; accessory muscle use for breathing noted.   Tachypneic at rest. Heart:  Tachycardic, irregular; no murmurs, clicks, rubs,  or gallops. Abdomen:  Soft, nontender and nondistended. No masses, hepatosplenomegaly or hernias noted. Normal bowel sounds, without guarding, and without rebound.     Msk:  Diffuse  ecchymoses; some edema extremities. Normal posture. Pulses:  Normal pulses noted. Extremities:  Without clubbing or edema. Neurologic:  Alert and  oriented x4;  grossly normal neurologically. Skin:  Poor turgor, scattered ecchymoses Psych:  Frustrated. Normal mood and affect.   Lab Results: Recent Labs    09/01/17 0822  WBC 13.1*  HGB 9.9*  HCT 31.8*  PLT 88*   BMET Recent Labs    09/01/17 0822  NA 134*  K 4.0  CL 89*  CO2 35*  GLUCOSE 121*  BUN 26*  CREATININE 1.01  CALCIUM 8.2*   LFT Recent Labs    09/01/17 0822  ALBUMIN 2.6*   PT/INR No results for input(s): LABPROT, INR in the last 72 hours.  Studies/Results: Dg Esophagus  Result Date: 09/01/2017 CLINICAL DATA:  Dysphagia and regurgitation. EXAM: ESOPHOGRAM/BARIUM SWALLOW TECHNIQUE: Single contrast examination was performed using  thin barium. FLUOROSCOPY TIME:  Fluoroscopy Time:  1 minutes and 41 seconds Radiation Exposure Index (if provided by the fluoroscopic device): Number of Acquired Spot Images: 0 COMPARISON:  None. FINDINGS: Esophageal dysmotility with disruption of the primary peristaltic wave, frequent tertiary contractions, esophageal spasm and moderate esophageal stasis. No definite hiatal hernia or GE reflux. Persistent area of smooth strictured narrowing just above the esophageal vestibule. The 13 mm barium pill would not pass through this area. Recommend endoscopic evaluation. IMPRESSION: Area of smooth strictured narrowing just above the esophageal vestibule. The 13 mm barium pill would not pass through this area. Esophageal dysmotility. Electronically Signed   By: Rudie Meyer M.D.   On: 09/01/2017 15:48    Impression:  1.  Nosocomial pneumonia. 2.  Severe pulmonary fibrosis. 3.  Severe tricuspid regurgitation. 4.  Pulmonary hypertension. 5.  GERD. 6.  Chronic dysphagia, history of esophageal dilatations. 7.  Abnormal esophagram, distal esophageal stricture and esophageal  dysmotility.  Plan:  1.  Patient is too high risk for endoscopy at the present time, given his recent pneumonia, and would be borderline endoscopy candidate at best even without pneumonia, in light of his severe pulmonary comorbidities.  During my exam with him, his respiratory rate was about 30 and his oxygen saturation was hovering in low-to-mid 80s even on 6L oxygen via nasal cannula. 2.  I think a trial of soft diet is reasonable, and I discussed this with primary team, who will also solicit Speech Therapy input, given concern for patient with aspiration, prior to putting in any diet orders. 3.  Would not do endoscopy for at least the next few weeks, unless there is an urgent indication, with the understanding there would be ~50% chance of patient ending up on a ventilator (off of which would be well nigh impossible).  Would have to look a lot better for me to give any consideration to doing non-urgent endoscopy. 4.  Case discussed with  nurse practitioner on [facility] team. 5.  [facility] will sign-off; thank you for the consultation; please call back as needs arise.   LOS: 0 days   Bobie Caris M  09/02/2017, 1:37 PM  Cell [phone] If no answer or after 5 PM call [phone]

## 2017-09-02 NOTE — Progress Notes (Signed)
Pulmonary Critical Care Medicine [facility] [place]   PULMONARY SERVICE  PROGRESS NOTE  Date of Service: 09/02/2017  Philip Clemon Sliva Sr.  ZOX:[id number]  DOB: [birth year]   DOA: 08/08/2017  Referring Physician: Carron Curie, MD  HPI: Philip Spychalski Mcgue Sr. is a 77 y.o. male seen for follow up of Acute on Chronic Respiratory Failure.  Patient has been on 4 L nasal cannula has had some desaturations noted but overall is doing fairly well.  Was seen by GI today for evaluation.  Medications: Reviewed on Rounds  Physical Exam:  Vitals: Temperature 97.5 pulse 80 respiratory rate 15 blood pressure 120/62 saturations 97%  Ventilator Settings off the ventilator at this time  . General: Comfortable at this time . Eyes: Grossly normal lids, irises & conjunctiva . ENT: grossly tongue is normal . Neck: no obvious mass . Cardiovascular: S1 S2 normal no gallop . Respiratory: Scattered rhonchi noted . Abdomen: soft . Skin: no rash seen on limited exam . Musculoskeletal: not rigid . Psychiatric:unable to assess . Neurologic: no seizure no involuntary movements         Labs on Admission:  Basic Metabolic Panel: Recent Labs  Lab 08/27/17 0516 08/29/17 0437 08/30/17 0515 09/01/17 0822  NA  --  140 135 134*  K 3.4* 3.4* 3.7 4.0  CL  --  94* 91* 89*  CO2  --  33* 36* 35*  GLUCOSE  --  104* 117* 121*  BUN  --  32* 28* 26*  CREATININE  --  1.22 1.09 1.01  CALCIUM  --  8.4* 8.0* 8.2*  MG  --   --   --  2.3  PHOS  --   --   --  4.5    Liver Function Tests: Recent Labs  Lab 09/01/17 0822  ALBUMIN 2.6*   No results for input(s): LIPASE, AMYLASE in the last 168 hours. No results for input(s): AMMONIA in the last 168 hours.  CBC: Recent Labs  Lab 08/29/17 0437 09/01/17 0822  WBC 15.8* 13.1*  HGB 10.1* 9.9*  HCT 32.9* 31.8*  MCV 78.9 78.9  PLT 229 88*    Cardiac Enzymes: No results for input(s): CKTOTAL, CKMB, CKMBINDEX, TROPONINI in the last 168 hours.  BNP (last  3 results) No results for input(s): BNP in the last 8760 hours.  ProBNP (last 3 results) No results for input(s): PROBNP in the last 8760 hours.  Radiological Exams on Admission: Dg Esophagus  Result Date: 09/01/2017 CLINICAL DATA:  Dysphagia and regurgitation. EXAM: ESOPHOGRAM/BARIUM SWALLOW TECHNIQUE: Single contrast examination was performed using  thin barium. FLUOROSCOPY TIME:  Fluoroscopy Time:  1 minutes and 41 seconds Radiation Exposure Index (if provided by the fluoroscopic device): Number of Acquired Spot Images: 0 COMPARISON:  None. FINDINGS: Esophageal dysmotility with disruption of the primary peristaltic wave, frequent tertiary contractions, esophageal spasm and moderate esophageal stasis. No definite hiatal hernia or GE reflux. Persistent area of smooth strictured narrowing just above the esophageal vestibule. The 13 mm barium pill would not pass through this area. Recommend endoscopic evaluation. IMPRESSION: Area of smooth strictured narrowing just above the esophageal vestibule. The 13 mm barium pill would not pass through this area. Esophageal dysmotility. Electronically Signed   By: Rudie Meyer M.D.   On: 09/01/2017 15:48    Assessment/Plan Active Problems:   Acute on chronic respiratory failure with hypoxia (HCC)   Idiopathic diffuse interstitial pulmonary fibrosis (HCC)   Pulmonary hypertension (HCC)   Healthcare-associated pneumonia   Acute on chronic  combined systolic and diastolic CHF (congestive heart failure) (HCC)   1. Acute on chronic respiratory failure with hypoxia we will continue with oxygen therapy.  He has significant desaturations with minor activity need to continue to monitor closely. 2. Idiopathic diffuse interstitial fibrosis we will continue with supportive care 3. Pulmonary hypertension at baseline 4. Healthcare associated pneumonia is doing well treated 5. Acute combined systolic diastolic heart failure right now is compensated we will  monitor   I have personally seen and evaluated the patient, evaluated laboratory and imaging results, formulated the assessment and plan and placed orders. The Patient requires high complexity decision making for assessment and support.  Case was discussed on Rounds with the Respiratory Therapy Staff  Yevonne Pax, MD [facility] Pulmonary Critical Care Medicine Sleep Medicine

## 2017-09-03 ENCOUNTER — Other Ambulatory Visit (HOSPITAL_COMMUNITY): Payer: Medicare Other

## 2017-09-03 DIAGNOSIS — J189 Pneumonia, unspecified organism: Secondary | ICD-10-CM | POA: Diagnosis not present

## 2017-09-03 DIAGNOSIS — I5043 Acute on chronic combined systolic (congestive) and diastolic (congestive) heart failure: Secondary | ICD-10-CM | POA: Diagnosis not present

## 2017-09-03 DIAGNOSIS — J9621 Acute and chronic respiratory failure with hypoxia: Secondary | ICD-10-CM | POA: Diagnosis not present

## 2017-09-03 DIAGNOSIS — I509 Heart failure, unspecified: Secondary | ICD-10-CM | POA: Diagnosis not present

## 2017-09-03 DIAGNOSIS — I272 Pulmonary hypertension, unspecified: Secondary | ICD-10-CM | POA: Diagnosis not present

## 2017-09-03 DIAGNOSIS — J84112 Idiopathic pulmonary fibrosis: Secondary | ICD-10-CM | POA: Diagnosis not present

## 2017-09-03 LAB — RENAL FUNCTION PANEL
ALBUMIN: 2.2 g/dL — AB (ref 3.5–5.0)
ANION GAP: 10 (ref 5–15)
BUN: 23 mg/dL — ABNORMAL HIGH (ref 6–20)
CO2: 38 mmol/L — ABNORMAL HIGH (ref 22–32)
Calcium: 8.5 mg/dL — ABNORMAL LOW (ref 8.9–10.3)
Chloride: 91 mmol/L — ABNORMAL LOW (ref 101–111)
Creatinine, Ser: 1.02 mg/dL (ref 0.61–1.24)
Glucose, Bld: 53 mg/dL — ABNORMAL LOW (ref 65–99)
PHOSPHORUS: 4.3 mg/dL (ref 2.5–4.6)
Potassium: 3.9 mmol/L (ref 3.5–5.1)
Sodium: 139 mmol/L (ref 135–145)

## 2017-09-03 LAB — CBC
HCT: 27.8 % — ABNORMAL LOW (ref 39.0–52.0)
HEMOGLOBIN: 8.5 g/dL — AB (ref 13.0–17.0)
MCH: 23.8 pg — AB (ref 26.0–34.0)
MCHC: 30.6 g/dL (ref 30.0–36.0)
MCV: 77.9 fL — ABNORMAL LOW (ref 78.0–100.0)
Platelets: 58 10*3/uL — ABNORMAL LOW (ref 150–400)
RBC: 3.57 MIL/uL — AB (ref 4.22–5.81)
RDW: 16.4 % — ABNORMAL HIGH (ref 11.5–15.5)
WBC: 12.2 10*3/uL — AB (ref 4.0–10.5)

## 2017-09-03 LAB — MAGNESIUM: MAGNESIUM: 2.2 mg/dL (ref 1.7–2.4)

## 2017-09-03 NOTE — Progress Notes (Signed)
Pulmonary Critical Care Medicine [facility] [place]   PULMONARY SERVICE  PROGRESS NOTE  Date of Service: 09/03/2017  Philip Newey Liberatore Sr.  WUJ:[id number]  DOB: October 13, 1940   DOA: 08/15/2017  Referring Physician: Carron Curie, MD  HPI: Philip Magid Kinnamon Sr. is a 77 y.o. male seen for follow up of Acute on Chronic Respiratory Failure.  Patient is not doing well has been requiring increasing oxygen.  Chest x-ray was done reveals a right-sided dense pneumonia.  Patient is a DNI at this time  Medications: Reviewed on Rounds  Physical Exam:  Vitals: Temperature 98.6 pulse 90 respiratory rate 24 blood pressure 130/89 saturations 94%  Ventilator Settings patient was off the ventilator at this time  . General: Comfortable at this time . Eyes: Grossly normal lids, irises & conjunctiva . ENT: grossly tongue is normal . Neck: no obvious mass . Cardiovascular: S1 S2 normal no gallop . Respiratory: Coarse breath sounds few rhonchi . Abdomen: soft . Skin: no rash seen on limited exam . Musculoskeletal: not rigid . Psychiatric:unable to assess . Neurologic: no seizure no involuntary movements         Labs on Admission:  Basic Metabolic Panel: Recent Labs  Lab 08/29/17 0437 08/30/17 0515 09/01/17 0822 09/03/17 0553  NA 140 135 134* 139  K 3.4* 3.7 4.0 3.9  CL 94* 91* 89* 91*  CO2 33* 36* 35* 38*  GLUCOSE 104* 117* 121* 53*  BUN 32* 28* 26* 23*  CREATININE 1.22 1.09 1.01 1.02  CALCIUM 8.4* 8.0* 8.2* 8.5*  MG  --   --  2.3 2.2  PHOS  --   --  4.5 4.3    Liver Function Tests: Recent Labs  Lab 09/01/17 0822 09/03/17 0553  ALBUMIN 2.6* 2.2*   No results for input(s): LIPASE, AMYLASE in the last 168 hours. No results for input(s): AMMONIA in the last 168 hours.  CBC: Recent Labs  Lab 08/29/17 0437 09/01/17 0822 09/03/17 0553  WBC 15.8* 13.1* 12.2*  HGB 10.1* 9.9* 8.5*  HCT 32.9* 31.8* 27.8*  MCV 78.9 78.9 77.9*  PLT 229 88* 58*    Cardiac Enzymes: No  results for input(s): CKTOTAL, CKMB, CKMBINDEX, TROPONINI in the last 168 hours.  BNP (last 3 results) No results for input(s): BNP in the last 8760 hours.  ProBNP (last 3 results) No results for input(s): PROBNP in the last 8760 hours.  Radiological Exams on Admission: Dg Esophagus  Result Date: 09/01/2017 CLINICAL DATA:  Dysphagia and regurgitation. EXAM: ESOPHOGRAM/BARIUM SWALLOW TECHNIQUE: Single contrast examination was performed using  thin barium. FLUOROSCOPY TIME:  Fluoroscopy Time:  1 minutes and 41 seconds Radiation Exposure Index (if provided by the fluoroscopic device): Number of Acquired Spot Images: 0 COMPARISON:  None. FINDINGS: Esophageal dysmotility with disruption of the primary peristaltic wave, frequent tertiary contractions, esophageal spasm and moderate esophageal stasis. No definite hiatal hernia or GE reflux. Persistent area of smooth strictured narrowing just above the esophageal vestibule. The 13 mm barium pill would not pass through this area. Recommend endoscopic evaluation. IMPRESSION: Area of smooth strictured narrowing just above the esophageal vestibule. The 13 mm barium pill would not pass through this area. Esophageal dysmotility. Electronically Signed   By: Rudie Meyer M.D.   On: 09/01/2017 15:48   Dg Chest Port 1 View  Result Date: 09/03/2017 CLINICAL DATA:  Respiratory distress EXAM: PORTABLE CHEST 1 VIEW COMPARISON:  Aug 27, 2017 FINDINGS: There is widespread consolidation throughout much of the right lung, likely due to pneumonia.  There is underlying fibrosis with questionable superimposed interstitial edema. There is mild cardiomegaly with pulmonary vascularity normal. There are postoperative changes on the right. There is stable opacity in the right apex region. IMPRESSION: 1. Extensive airspace consolidation throughout much of the right lung, likely widespread pneumonia. 2. Asymmetric opacity right upper lobe in the apex region. A mass in this area cannot be  excluded. Contrast enhanced chest CT advised to further assess this area. 3. Underlying parenchymal fibrosis with questionable mild superimposed interstitial edema. 4.  Areas of postoperative change on the right. 5.  Mild cardiomegaly. These results will be called to the ordering clinician or representative by the Radiologist Assistant, and communication documented in the PACS or zVision Dashboard. Electronically Signed   By: Bretta Bang III M.D.   On: 09/03/2017 10:38    Assessment/Plan Active Problems:   Acute on chronic respiratory failure with hypoxia (HCC)   Idiopathic diffuse interstitial pulmonary fibrosis (HCC)   Pulmonary hypertension (HCC)   Healthcare-associated pneumonia   Acute on chronic combined systolic and diastolic CHF (congestive heart failure) (HCC)   1. Acute on chronic respiratory failure with hypoxia patient has worsening of oxygenation.  Probably will need to go on BiPAP I have given the orders for the BiPAP to the respiratory therapist.  We will continue with supportive care overall prognosis is quite guarded. 2. Diffuse pulmonary fibrosis at baseline we will continue present management. 3. Healthcare associated pneumonia and the chest x-ray showed worsening we will discuss with primary care regarding antibiotics and plan of care. 4. Acute on chronic combined systolic diastolic heart failure right now is compensated we will continue with present therapy 5. Pulmonary hypertension at baseline prognosis.   I have personally seen and evaluated the patient, evaluated laboratory and imaging results, formulated the assessment and plan and placed orders. The Patient requires high complexity decision making for assessment and support.  Case was discussed on Rounds with the Respiratory Therapy Staff  Yevonne Pax, MD [facility] Pulmonary Critical Care Medicine Sleep Medicine

## 2017-09-04 DIAGNOSIS — J189 Pneumonia, unspecified organism: Secondary | ICD-10-CM | POA: Diagnosis not present

## 2017-09-04 DIAGNOSIS — J9621 Acute and chronic respiratory failure with hypoxia: Secondary | ICD-10-CM | POA: Diagnosis not present

## 2017-09-04 DIAGNOSIS — I509 Heart failure, unspecified: Secondary | ICD-10-CM | POA: Diagnosis not present

## 2017-09-04 DIAGNOSIS — I5043 Acute on chronic combined systolic (congestive) and diastolic (congestive) heart failure: Secondary | ICD-10-CM | POA: Diagnosis not present

## 2017-09-04 LAB — CBC
HCT: 27.6 % — ABNORMAL LOW (ref 39.0–52.0)
Hemoglobin: 8.5 g/dL — ABNORMAL LOW (ref 13.0–17.0)
MCH: 24 pg — ABNORMAL LOW (ref 26.0–34.0)
MCHC: 30.8 g/dL (ref 30.0–36.0)
MCV: 78 fL (ref 78.0–100.0)
PLATELETS: 28 10*3/uL — AB (ref 150–400)
RBC: 3.54 MIL/uL — ABNORMAL LOW (ref 4.22–5.81)
RDW: 17 % — ABNORMAL HIGH (ref 11.5–15.5)
WBC: 10 10*3/uL (ref 4.0–10.5)

## 2017-09-04 LAB — RENAL FUNCTION PANEL
ALBUMIN: 2.2 g/dL — AB (ref 3.5–5.0)
Anion gap: 9 (ref 5–15)
BUN: 26 mg/dL — ABNORMAL HIGH (ref 6–20)
CHLORIDE: 93 mmol/L — AB (ref 101–111)
CO2: 33 mmol/L — ABNORMAL HIGH (ref 22–32)
CREATININE: 1.11 mg/dL (ref 0.61–1.24)
Calcium: 7.7 mg/dL — ABNORMAL LOW (ref 8.9–10.3)
GFR calc Af Amer: >60 mL/min (ref >60)
Glucose, Bld: 100 mg/dL — ABNORMAL HIGH (ref 65–99)
Phosphorus: 4.8 mg/dL — ABNORMAL HIGH (ref 2.5–4.6)
Potassium: 3.8 mmol/L (ref 3.5–5.1)
Sodium: 135 mmol/L (ref 135–145)

## 2017-09-04 LAB — MAGNESIUM: MAGNESIUM: 2.1 mg/dL (ref 1.7–2.4)

## 2017-09-04 NOTE — Progress Notes (Signed)
Pulmonary Critical Care Medicine [facility] [place]   PULMONARY SERVICE  PROGRESS NOTE  Date of Service: 09/04/2017  Philip Yepiz Coutant Sr.  ZOX:[id number]  DOB: [birth year]   DOA: 08-23-2017  Referring Physician: Carron Curie, MD  HPI: Philip Mesta Nordhoff Sr. is a 77 y.o. male seen for follow up of Acute on Chronic Respiratory Failure.  Patient is not doing very well percent oxygen has been started on BiPAP.  As mentioned patient is a DNI so therefore will not be intubated.  Prognosis is poor  Medications: Reviewed on Rounds  Physical Exam:  Vitals: Temperature 97.1 pulse 74 respiratory rate 14 blood pressure 129/69 saturations 100%  Ventilator Settings currently is on BiPAP 16/8 100% FiO2  . General: Comfortable at this time . Eyes: Grossly normal lids, irises & conjunctiva . ENT: grossly tongue is normal . Neck: no obvious mass . Cardiovascular: S1 S2 normal no gallop . Respiratory: Coarse rhonchi noted bilaterally . Abdomen: soft . Skin: no rash seen on limited exam . Musculoskeletal: not rigid . Psychiatric:unable to assess . Neurologic: no seizure no involuntary movements         Labs on Admission:  Basic Metabolic Panel: Recent Labs  Lab 08/29/17 0437 08/30/17 0515 09/01/17 0822 09/03/17 0553 09/04/17 1003  NA 140 135 134* 139 135  K 3.4* 3.7 4.0 3.9 3.8  CL 94* 91* 89* 91* 93*  CO2 33* 36* 35* 38* 33*  GLUCOSE 104* 117* 121* 53* 100*  BUN 32* 28* 26* 23* 26*  CREATININE 1.22 1.09 1.01 1.02 1.11  CALCIUM 8.4* 8.0* 8.2* 8.5* 7.7*  MG  --   --  2.3 2.2 2.1  PHOS  --   --  4.5 4.3 4.8*    Liver Function Tests: Recent Labs  Lab 09/01/17 0822 09/03/17 0553 09/04/17 1003  ALBUMIN 2.6* 2.2* 2.2*   No results for input(s): LIPASE, AMYLASE in the last 168 hours. No results for input(s): AMMONIA in the last 168 hours.  CBC: Recent Labs  Lab 08/29/17 0437 09/01/17 0822 09/03/17 0553 09/04/17 1003  WBC 15.8* 13.1* 12.2* 10.0  HGB 10.1* 9.9* 8.5*  8.5*  HCT 32.9* 31.8* 27.8* 27.6*  MCV 78.9 78.9 77.9* 78.0  PLT 229 88* 58* 28*    Cardiac Enzymes: No results for input(s): CKTOTAL, CKMB, CKMBINDEX, TROPONINI in the last 168 hours.  BNP (last 3 results) No results for input(s): BNP in the last 8760 hours.  ProBNP (last 3 results) No results for input(s): PROBNP in the last 8760 hours.  Radiological Exams on Admission: Dg Esophagus  Result Date: 09/01/2017 CLINICAL DATA:  Dysphagia and regurgitation. EXAM: ESOPHOGRAM/BARIUM SWALLOW TECHNIQUE: Single contrast examination was performed using  thin barium. FLUOROSCOPY TIME:  Fluoroscopy Time:  1 minutes and 41 seconds Radiation Exposure Index (if provided by the fluoroscopic device): Number of Acquired Spot Images: 0 COMPARISON:  None. FINDINGS: Esophageal dysmotility with disruption of the primary peristaltic wave, frequent tertiary contractions, esophageal spasm and moderate esophageal stasis. No definite hiatal hernia or GE reflux. Persistent area of smooth strictured narrowing just above the esophageal vestibule. The 13 mm barium pill would not pass through this area. Recommend endoscopic evaluation. IMPRESSION: Area of smooth strictured narrowing just above the esophageal vestibule. The 13 mm barium pill would not pass through this area. Esophageal dysmotility. Electronically Signed   By: Rudie Meyer M.D.   On: 09/01/2017 15:48   Dg Chest Port 1 View  Result Date: 09/03/2017 CLINICAL DATA:  Respiratory distress EXAM: PORTABLE CHEST  1 VIEW COMPARISON:  Aug 27, 2017 FINDINGS: There is widespread consolidation throughout much of the right lung, likely due to pneumonia. There is underlying fibrosis with questionable superimposed interstitial edema. There is mild cardiomegaly with pulmonary vascularity normal. There are postoperative changes on the right. There is stable opacity in the right apex region. IMPRESSION: 1. Extensive airspace consolidation throughout much of the right lung, likely  widespread pneumonia. 2. Asymmetric opacity right upper lobe in the apex region. A mass in this area cannot be excluded. Contrast enhanced chest CT advised to further assess this area. 3. Underlying parenchymal fibrosis with questionable mild superimposed interstitial edema. 4.  Areas of postoperative change on the right. 5.  Mild cardiomegaly. These results will be called to the ordering clinician or representative by the Radiologist Assistant, and communication documented in the PACS or zVision Dashboard. Electronically Signed   By: Bretta Bang III M.D.   On: 09/03/2017 10:38    Assessment/Plan Active Problems:   Acute on chronic respiratory failure with hypoxia (HCC)   Idiopathic diffuse interstitial pulmonary fibrosis (HCC)   Pulmonary hypertension (HCC)   Healthcare-associated pneumonia   Acute on chronic combined systolic and diastolic CHF (congestive heart failure) (HCC)   1. Acute on chronic respiratory failure with hypoxia doing poorly right now patient is on BiPAP and has been requiring 100% FiO2.  Saturations are acceptable we will try to wean down if tolerated.  Continue with pulmonary toilet supportive care. 2. Healthcare associated pneumonia dense pneumonia noted on the right side patient had been on antibiotics we will continue with supportive care discussed with primary care 3. Pulmonary hypertension at baseline 4. Diffuse interstitial fibrosis poor prognosis 5. Acute on chronic combined systolic diastolic heart failure prognosis is guarded continue with present management and monitor the fluid status closely   I have personally seen and evaluated the patient, evaluated laboratory and imaging results, formulated the assessment and plan and placed orders. The Patient requires high complexity decision making for assessment and support.  Case was discussed on Rounds with the Respiratory Therapy Staff  Yevonne Pax, MD [facility] Pulmonary Critical Care Medicine Sleep  Medicine

## 2017-09-05 DIAGNOSIS — J9621 Acute and chronic respiratory failure with hypoxia: Secondary | ICD-10-CM | POA: Diagnosis not present

## 2017-09-05 DIAGNOSIS — I5043 Acute on chronic combined systolic (congestive) and diastolic (congestive) heart failure: Secondary | ICD-10-CM | POA: Diagnosis not present

## 2017-09-05 DIAGNOSIS — J189 Pneumonia, unspecified organism: Secondary | ICD-10-CM | POA: Diagnosis not present

## 2017-09-05 DIAGNOSIS — I509 Heart failure, unspecified: Secondary | ICD-10-CM | POA: Diagnosis not present

## 2017-09-05 LAB — CBC
HCT: 26.2 % — ABNORMAL LOW (ref 39.0–52.0)
HEMOGLOBIN: 7.9 g/dL — AB (ref 13.0–17.0)
MCH: 24.2 pg — ABNORMAL LOW (ref 26.0–34.0)
MCHC: 30.2 g/dL (ref 30.0–36.0)
MCV: 80.4 fL (ref 78.0–100.0)
PLATELETS: 22 10*3/uL — AB (ref 150–400)
RBC: 3.26 MIL/uL — ABNORMAL LOW (ref 4.22–5.81)
RDW: 17.6 % — AB (ref 11.5–15.5)
WBC: 8.3 10*3/uL (ref 4.0–10.5)

## 2017-09-05 LAB — MAGNESIUM: MAGNESIUM: 2.2 mg/dL (ref 1.7–2.4)

## 2017-09-05 LAB — RENAL FUNCTION PANEL
Albumin: 2 g/dL — ABNORMAL LOW (ref 3.5–5.0)
Anion gap: 8 (ref 5–15)
BUN: 22 mg/dL — ABNORMAL HIGH (ref 6–20)
CHLORIDE: 98 mmol/L — AB (ref 101–111)
CO2: 32 mmol/L (ref 22–32)
CREATININE: 1 mg/dL (ref 0.61–1.24)
Calcium: 7.7 mg/dL — ABNORMAL LOW (ref 8.9–10.3)
GFR calc Af Amer: >60 mL/min (ref >60)
GFR calc non Af Amer: >60 mL/min (ref >60)
GLUCOSE: 96 mg/dL (ref 65–99)
Phosphorus: 4 mg/dL (ref 2.5–4.6)
Potassium: 4.3 mmol/L (ref 3.5–5.1)
SODIUM: 138 mmol/L (ref 135–145)

## 2017-09-05 LAB — PREPARE RBC (CROSSMATCH)

## 2017-09-05 LAB — ABO/RH: ABO/RH(D): A NEG

## 2017-09-05 NOTE — Progress Notes (Signed)
[facility]   PULMONARY SERVICE  PROGRESS NOTE  Date of Service: 09/05/2017  Philip Momentmil D Fudala Sr.  FAO:[id number]  DOB: 1940-05-01   DOA: 08/10/2017  Referring Physician: Carron CurieAli Hijazi, MD  HPI: Philip Momentmil D Ikard Sr. is a 77 y.o. male seen for follow up of Acute on Chronic Respiratory Failure.  Currently is on BiPAP no distress.  Patient's saturations are 100% but he is requiring 100% FiO2.  Anytime his oxygen is decreased patient has significant drop in saturations.  He is as already mentioned a DNI DNR  Medications: Reviewed on Rounds  Physical Exam:  Vitals: Temperature 98.1 pulse 86 respiratory 14 blood pressure 99/61 saturations 98%  Ventilator Settings BiPAP 18/8 FiO2 100%  . General: Comfortable at this time . Eyes: Grossly normal lids, irises & conjunctiva . ENT: grossly tongue is normal . Neck: no obvious mass . Cardiovascular: S1 S2 normal no gallop . Respiratory: Coarse breath sounds noted bilaterally . Abdomen: soft . Skin: no rash seen on limited exam . Musculoskeletal: not rigid . Psychiatric:unable to assess . Neurologic: no seizure no involuntary movements         Labs on Admission:  Basic Metabolic Panel: Recent Labs  Lab 08/30/17 0515 09/01/17 0822 09/03/17 0553 09/04/17 1003 09/05/17 0923  NA 135 134* 139 135 138  K 3.7 4.0 3.9 3.8 4.3  CL 91* 89* 91* 93* 98*  CO2 36* 35* 38* 33* 32  GLUCOSE 117* 121* 53* 100* 96  BUN 28* 26* 23* 26* 22*  CREATININE 1.09 1.01 1.02 1.11 1.00  CALCIUM 8.0* 8.2* 8.5* 7.7* 7.7*  MG  --  2.3 2.2 2.1 2.2  PHOS  --  4.5 4.3 4.8* 4.0    Liver Function Tests: Recent Labs  Lab 09/01/17 0822 09/03/17 0553 09/04/17 1003 09/05/17 0923  ALBUMIN 2.6* 2.2* 2.2* 2.0*   No results for input(s): LIPASE, AMYLASE in the last 168 hours. No results for input(s): AMMONIA in the last 168 hours.  CBC: Recent Labs  Lab 09/01/17 0822 09/03/17 0553 09/04/17 1003 09/05/17 0923   WBC 13.1* 12.2* 10.0 8.3  HGB 9.9* 8.5* 8.5* 7.9*  HCT 31.8* 27.8* 27.6* 26.2*  MCV 78.9 77.9* 78.0 80.4  PLT 88* 58* 28* 22*    Cardiac Enzymes: No results for input(s): CKTOTAL, CKMB, CKMBINDEX, TROPONINI in the last 168 hours.  BNP (last 3 results) No results for input(s): BNP in the last 8760 hours.  ProBNP (last 3 results) No results for input(s): PROBNP in the last 8760 hours.  Radiological Exams on Admission: Dg Esophagus  Result Date: 09/01/2017 CLINICAL DATA:  Dysphagia and regurgitation. EXAM: ESOPHOGRAM/BARIUM SWALLOW TECHNIQUE: Single contrast examination was performed using  thin barium. FLUOROSCOPY TIME:  Fluoroscopy Time:  1 minutes and 41 seconds Radiation Exposure Index (if provided by the fluoroscopic device): Number of Acquired Spot Images: 0 COMPARISON:  None. FINDINGS: Esophageal dysmotility with disruption of the primary peristaltic wave, frequent tertiary contractions, esophageal spasm and moderate esophageal stasis. No definite hiatal hernia or GE reflux. Persistent area of smooth strictured narrowing just above the esophageal vestibule. The 13 mm barium pill would not pass through this area. Recommend endoscopic evaluation. IMPRESSION: Area of smooth strictured narrowing just above the esophageal vestibule. The 13 mm barium pill would not pass through this area. Esophageal dysmotility. Electronically Signed   By: Rudie MeyerP.  Gallerani M.D.   On: 09/01/2017 15:48   Dg Chest Port 1 View  Result Date: 09/03/2017 CLINICAL DATA:  Respiratory  distress EXAM: PORTABLE CHEST 1 VIEW COMPARISON:  Aug 27, 2017 FINDINGS: There is widespread consolidation throughout much of the right lung, likely due to pneumonia. There is underlying fibrosis with questionable superimposed interstitial edema. There is mild cardiomegaly with pulmonary vascularity normal. There are postoperative changes on the right. There is stable opacity in the right apex region. IMPRESSION: 1. Extensive airspace  consolidation throughout much of the right lung, likely widespread pneumonia. 2. Asymmetric opacity right upper lobe in the apex region. A mass in this area cannot be excluded. Contrast enhanced chest CT advised to further assess this area. 3. Underlying parenchymal fibrosis with questionable mild superimposed interstitial edema. 4.  Areas of postoperative change on the right. 5.  Mild cardiomegaly. These results will be called to the ordering clinician or representative by the Radiologist Assistant, and communication documented in the PACS or zVision Dashboard. Electronically Signed   By: Bretta Bang III M.D.   On: 09/03/2017 10:38    Assessment/Plan Active Problems:   Acute on chronic respiratory failure with hypoxia (HCC)   Idiopathic diffuse interstitial pulmonary fibrosis (HCC)   Pulmonary hypertension (HCC)   Healthcare-associated pneumonia   Acute on chronic combined systolic and diastolic CHF (congestive heart failure) (HCC)   1. Acute on chronic respiratory failure with hypoxia severe end-stage we will continue with pulmonary toilet supportive care patient has diffuse interstitial fibrosis as advanced disease so unlikely to survive this hospitalization. 2. Pulmonary hypertension we will continue with present management 3. Healthcare associated pneumonia treated with antibiotics we will continue to follow 4. Acute on chronic combined systolic diastolic heart failure monitor fluid status last x-ray still showing significant cardiomegaly and the fibrosis as already noted. 5. Diffuse interstitial fibrosis we will continue with supportive care poor prognosis overall   I have personally seen and evaluated the patient, evaluated laboratory and imaging results, formulated the assessment and plan and placed orders. The Patient requires high complexity decision making for assessment and support.  Case was discussed on Rounds with the Respiratory Therapy Staff  Yevonne Pax, MD  [facility] Pulmonary Critical Care Medicine Sleep Medicine

## 2017-09-05 DEATH — deceased

## 2017-09-06 DIAGNOSIS — J189 Pneumonia, unspecified organism: Secondary | ICD-10-CM | POA: Diagnosis not present

## 2017-09-06 DIAGNOSIS — I5043 Acute on chronic combined systolic (congestive) and diastolic (congestive) heart failure: Secondary | ICD-10-CM | POA: Diagnosis not present

## 2017-09-06 DIAGNOSIS — J9621 Acute and chronic respiratory failure with hypoxia: Secondary | ICD-10-CM | POA: Diagnosis not present

## 2017-09-06 DIAGNOSIS — J84112 Idiopathic pulmonary fibrosis: Secondary | ICD-10-CM | POA: Diagnosis not present

## 2017-09-06 LAB — BPAM PLATELET PHERESIS
BLOOD PRODUCT EXPIRATION DATE: 201906022359
Blood Product Expiration Date: 201906032359
ISSUE DATE / TIME: 201906012006
ISSUE DATE / TIME: 201906011811
UNIT TYPE AND RH: 6200
Unit Type and Rh: 5100

## 2017-09-06 LAB — BPAM RBC
BLOOD PRODUCT EXPIRATION DATE: 201906092359
ISSUE DATE / TIME: 201906012124
UNIT TYPE AND RH: 600

## 2017-09-06 LAB — RENAL FUNCTION PANEL
ANION GAP: 6 (ref 5–15)
Albumin: 2 g/dL — ABNORMAL LOW (ref 3.5–5.0)
BUN: 20 mg/dL (ref 6–20)
CO2: 33 mmol/L — AB (ref 22–32)
Calcium: 7.6 mg/dL — ABNORMAL LOW (ref 8.9–10.3)
Chloride: 101 mmol/L (ref 101–111)
Creatinine, Ser: 0.91 mg/dL (ref 0.61–1.24)
GFR calc Af Amer: >60 mL/min (ref >60)
GFR calc non Af Amer: >60 mL/min (ref >60)
GLUCOSE: 109 mg/dL — AB (ref 65–99)
POTASSIUM: 4 mmol/L (ref 3.5–5.1)
Phosphorus: 3.4 mg/dL (ref 2.5–4.6)
Sodium: 140 mmol/L (ref 135–145)

## 2017-09-06 LAB — PREPARE PLATELET PHERESIS
UNIT DIVISION: 0
Unit division: 0

## 2017-09-06 LAB — MAGNESIUM: Magnesium: 2.1 mg/dL (ref 1.7–2.4)

## 2017-09-06 LAB — CBC
HEMATOCRIT: 26.7 % — AB (ref 39.0–52.0)
Hemoglobin: 8.2 g/dL — ABNORMAL LOW (ref 13.0–17.0)
MCH: 25 pg — ABNORMAL LOW (ref 26.0–34.0)
MCHC: 30.7 g/dL (ref 30.0–36.0)
MCV: 81.4 fL (ref 78.0–100.0)
Platelets: 44 10*3/uL — ABNORMAL LOW (ref 150–400)
RBC: 3.28 MIL/uL — ABNORMAL LOW (ref 4.22–5.81)
RDW: 17.4 % — ABNORMAL HIGH (ref 11.5–15.5)
WBC: 8 10*3/uL (ref 4.0–10.5)

## 2017-09-06 LAB — TYPE AND SCREEN
ABO/RH(D): A NEG
ANTIBODY SCREEN: NEGATIVE
UNIT DIVISION: 0

## 2017-09-06 NOTE — Progress Notes (Signed)
[facility]   PULMONARY SERVICE  PROGRESS NOTE  Date of Service: 09/06/2017  Philip Momentmil D Farooq Sr.  WUJ:[id number]  DOB: 1941/04/02   DOA: 12-21-17  Referring Physician: Carron CurieAli Hijazi, MD  HPI: Philip Momentmil D Berquist Sr. is a 77 y.o. male seen for follow up of Acute on Chronic Respiratory Failure.  Patient is on BiPAP doing poorly.  Has been on a pressure of 18/8.  The patient's family was in the room and they were updated.  He has been on 100% FiO2 patient is very clear about not wanting to be intubated.  The son who was present is leaning more towards comfort care as is the wife but she is not quite ready to declare that yet.  Counseled the family regarding what comfort care measures RN also suggested that we go with morphine and sedation as patient has had increased air hunger and increased work of breathing  Medications: Reviewed on Rounds  Physical Exam:  Vitals: Temperature 97.1 pulse 68 respiratory rate 17 blood pressure 145/77 saturations 100%  Ventilator Settings BiPAP 18/8 saturations 100%  . General: Comfortable at this time . Eyes: Grossly normal lids, irises & conjunctiva . ENT: grossly tongue is normal . Neck: no obvious mass . Cardiovascular: S1 S2 normal no gallop . Respiratory: Coarse breath sounds no rhonchi noted . Abdomen: soft . Skin: no rash seen on limited exam . Musculoskeletal: not rigid . Psychiatric:unable to assess . Neurologic: no seizure no involuntary movements         Labs on Admission:  Basic Metabolic Panel: Recent Labs  Lab 09/01/17 0822 09/03/17 0553 09/04/17 1003 09/05/17 0923 09/06/17 0844  NA 134* 139 135 138 140  K 4.0 3.9 3.8 4.3 4.0  CL 89* 91* 93* 98* 101  CO2 35* 38* 33* 32 33*  GLUCOSE 121* 53* 100* 96 109*  BUN 26* 23* 26* 22* 20  CREATININE 1.01 1.02 1.11 1.00 0.91  CALCIUM 8.2* 8.5* 7.7* 7.7* 7.6*  MG 2.3 2.2 2.1 2.2 2.1  PHOS 4.5 4.3 4.8* 4.0 3.4    Liver Function  Tests: Recent Labs  Lab 09/01/17 0822 09/03/17 0553 09/04/17 1003 09/05/17 0923 09/06/17 0844  ALBUMIN 2.6* 2.2* 2.2* 2.0* 2.0*   No results for input(s): LIPASE, AMYLASE in the last 168 hours. No results for input(s): AMMONIA in the last 168 hours.  CBC: Recent Labs  Lab 09/01/17 0822 09/03/17 0553 09/04/17 1003 09/05/17 0923 09/06/17 0844  WBC 13.1* 12.2* 10.0 8.3 8.0  HGB 9.9* 8.5* 8.5* 7.9* 8.2*  HCT 31.8* 27.8* 27.6* 26.2* 26.7*  MCV 78.9 77.9* 78.0 80.4 81.4  PLT 88* 58* 28* 22* 44*    Cardiac Enzymes: No results for input(s): CKTOTAL, CKMB, CKMBINDEX, TROPONINI in the last 168 hours.  BNP (last 3 results) No results for input(s): BNP in the last 8760 hours.  ProBNP (last 3 results) No results for input(s): PROBNP in the last 8760 hours.  Radiological Exams on Admission: Dg Chest Port 1 View  Result Date: 09/03/2017 CLINICAL DATA:  Respiratory distress EXAM: PORTABLE CHEST 1 VIEW COMPARISON:  Aug 27, 2017 FINDINGS: There is widespread consolidation throughout much of the right lung, likely due to pneumonia. There is underlying fibrosis with questionable superimposed interstitial edema. There is mild cardiomegaly with pulmonary vascularity normal. There are postoperative changes on the right. There is stable opacity in the right apex region. IMPRESSION: 1. Extensive airspace consolidation throughout much of the right lung, likely widespread pneumonia. 2. Asymmetric  opacity right upper lobe in the apex region. A mass in this area cannot be excluded. Contrast enhanced chest CT advised to further assess this area. 3. Underlying parenchymal fibrosis with questionable mild superimposed interstitial edema. 4.  Areas of postoperative change on the right. 5.  Mild cardiomegaly. These results will be called to the ordering clinician or representative by the Radiologist Assistant, and communication documented in the PACS or zVision Dashboard. Electronically Signed   By: Bretta Bang III M.D.   On: 09/03/2017 10:38    Assessment/Plan Active Problems:   Acute on chronic respiratory failure with hypoxia (HCC)   Idiopathic diffuse interstitial pulmonary fibrosis (HCC)   Pulmonary hypertension (HCC)   Healthcare-associated pneumonia   Acute on chronic combined systolic and diastolic CHF (congestive heart failure) (HCC)   1. Acute on chronic respiratory failure with hypoxia continue with BiPAP overall prognosis is poor continue with supportive care we will give fentanyl for increased anxiety patient apparently does not tolerate morphine 2. Diffuse interstitial fibrosis poor prognosis 3. Pulmonary hypertension at baseline we will continue to follow 4. Healthcare associated pneumonia treated with antibiotics overall prognosis guarded 5. Acute on chronic systolic diastolic heart failure poor prognosis we will continue supportive care   I have personally seen and evaluated the patient, evaluated laboratory and imaging results, formulated the assessment and plan and placed orders. The Patient requires high complexity decision making for assessment and support.  Case was discussed on Rounds with the Respiratory Therapy Staff Time 35 minutes regarding patient's chart review family counseling and discussion with primary care team  Yevonne Pax, MD [facility] Pulmonary Critical Care Medicine Sleep Medicine

## 2017-09-07 ENCOUNTER — Other Ambulatory Visit (HOSPITAL_COMMUNITY): Payer: Medicare Other

## 2017-09-07 LAB — RENAL FUNCTION PANEL
Albumin: 2.2 g/dL — ABNORMAL LOW (ref 3.5–5.0)
Anion gap: 9 (ref 5–15)
BUN: 23 mg/dL — AB (ref 6–20)
CHLORIDE: 103 mmol/L (ref 101–111)
CO2: 33 mmol/L — AB (ref 22–32)
Calcium: 7.8 mg/dL — ABNORMAL LOW (ref 8.9–10.3)
Creatinine, Ser: 0.87 mg/dL (ref 0.61–1.24)
GFR calc Af Amer: >60 mL/min (ref >60)
GLUCOSE: 122 mg/dL — AB (ref 65–99)
POTASSIUM: 3.7 mmol/L (ref 3.5–5.1)
Phosphorus: 3.7 mg/dL (ref 2.5–4.6)
Sodium: 145 mmol/L (ref 135–145)

## 2017-09-07 LAB — CBC
HEMATOCRIT: 29.6 % — AB (ref 39.0–52.0)
Hemoglobin: 9.1 g/dL — ABNORMAL LOW (ref 13.0–17.0)
MCH: 24.7 pg — AB (ref 26.0–34.0)
MCHC: 30.7 g/dL (ref 30.0–36.0)
MCV: 80.4 fL (ref 78.0–100.0)
Platelets: 32 10*3/uL — ABNORMAL LOW (ref 150–400)
RBC: 3.68 MIL/uL — ABNORMAL LOW (ref 4.22–5.81)
RDW: 17.8 % — AB (ref 11.5–15.5)
WBC: 8.1 10*3/uL (ref 4.0–10.5)

## 2017-09-07 LAB — MAGNESIUM: MAGNESIUM: 2.2 mg/dL (ref 1.7–2.4)

## 2017-09-07 MED ORDER — GENERIC EXTERNAL MEDICATION
Status: DC
Start: ? — End: 2017-09-07

## 2017-09-08 LAB — HEPARIN INDUCED PLATELET AB (HIT ANTIBODY): HEPARIN INDUCED PLT AB: 0.144 {OD_unit} (ref 0.000–0.400)

## 2017-10-05 DEATH — deceased

## 2019-01-19 IMAGING — RF DG ESOPHAGUS
14 of 17 series · 14 of 17 positions shown · non-contrast
Comparison: None.

CLINICAL DATA: Dysphagia and regurgitation.

EXAM:
ESOPHOGRAM/BARIUM SWALLOW
TECHNIQUE: Single contrast examination was performed using  thin barium.
FLUOROSCOPY TIME:  Fluoroscopy Time:  1 minutes and 41 seconds
Radiation Exposure Index (if provided by the fluoroscopic device):
Number of Acquired Spot Images: 0

[Series 1: run · 1 of 1 slices shown (1 of 14)]
[im 1/1]
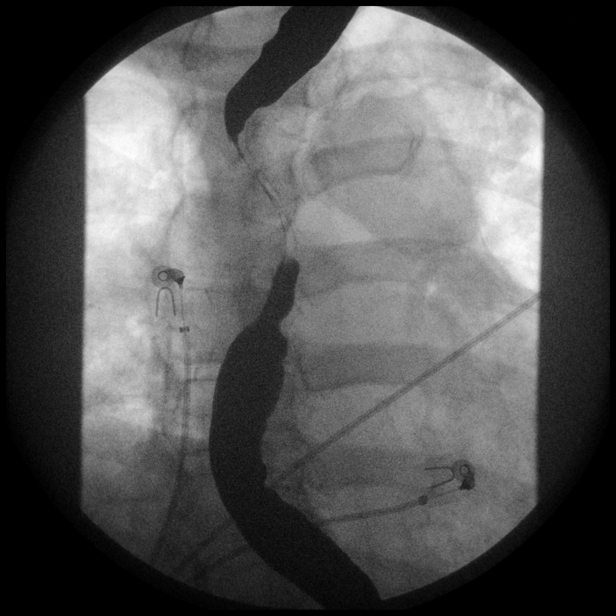

[Series 2: run · 1 of 1 slices shown (2 of 14)]
[im 1/1]
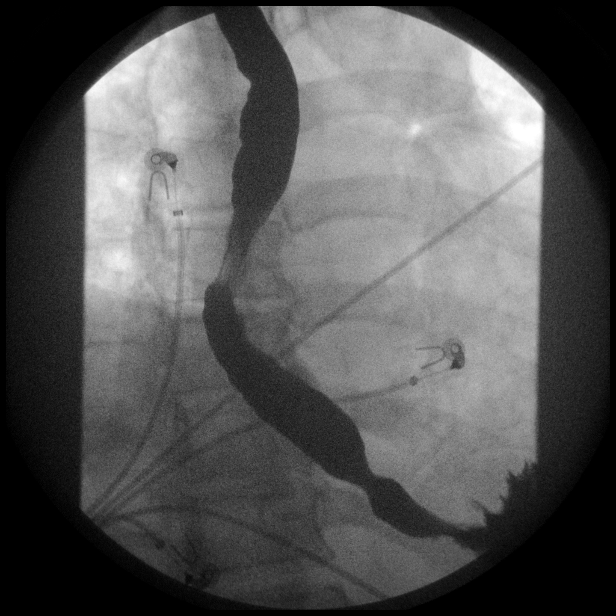

[Series 4: run · 1 of 1 slices shown (3 of 14)]
[im 1/1]
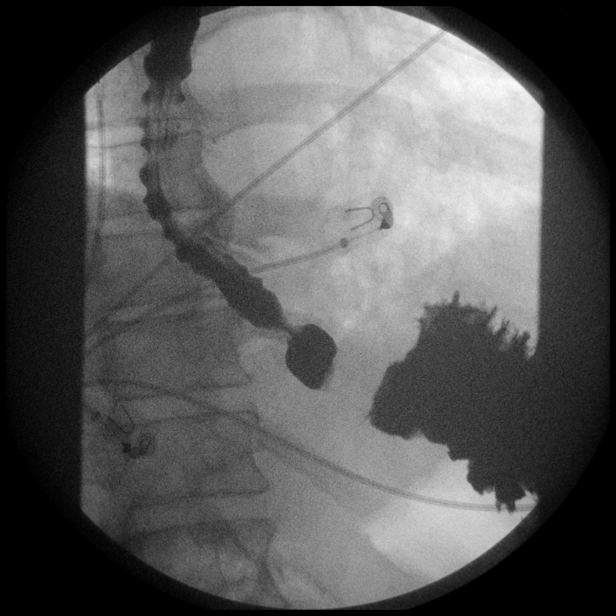

[Series 5: run · 1 of 1 slices shown (4 of 14)]
[im 1/1]
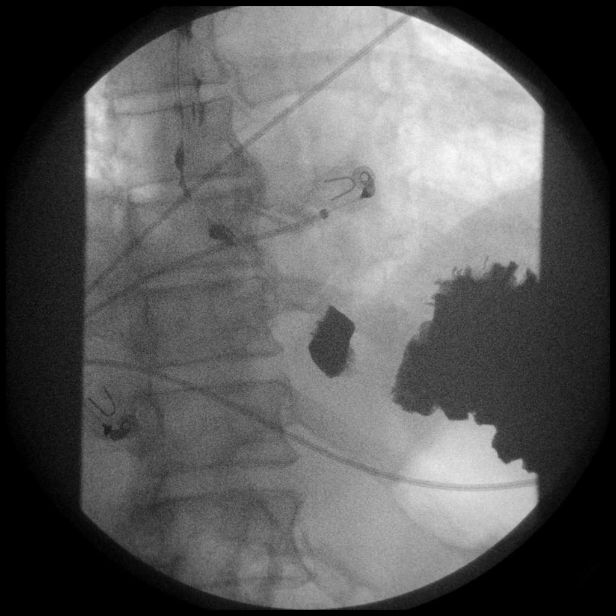

[Series 6: run · 1 of 1 slices shown (5 of 14)]
[im 1/1]
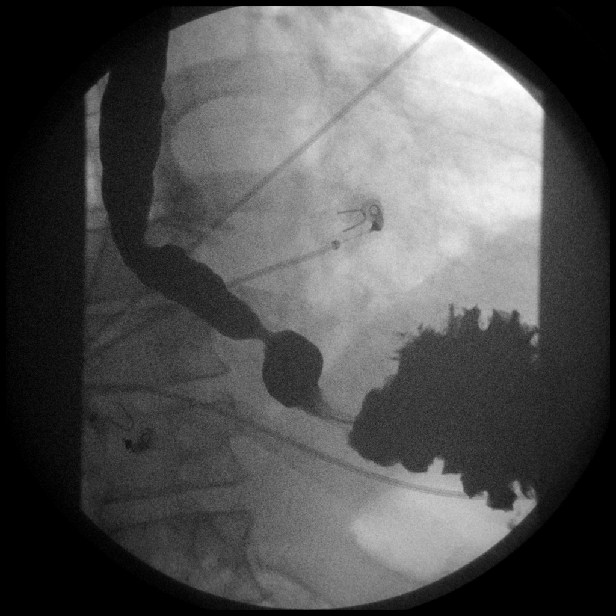

[Series 7: run · 1 of 1 slices shown (6 of 14)]
[im 1/1]
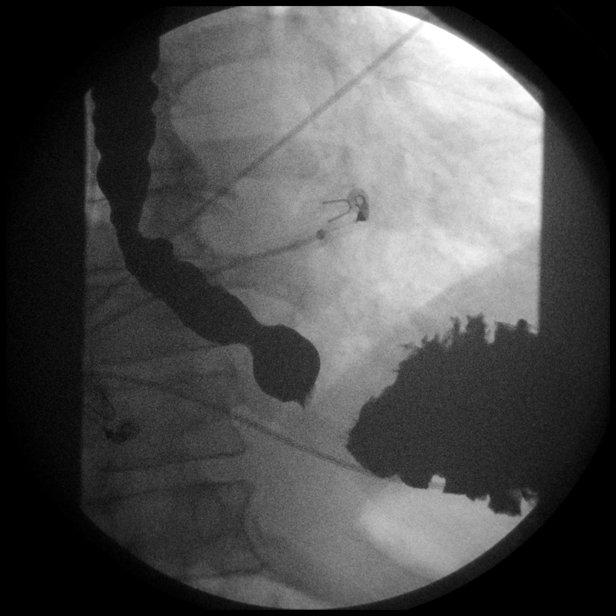

[Series 8: run · 1 of 1 slices shown (7 of 14)]
[im 1/1]
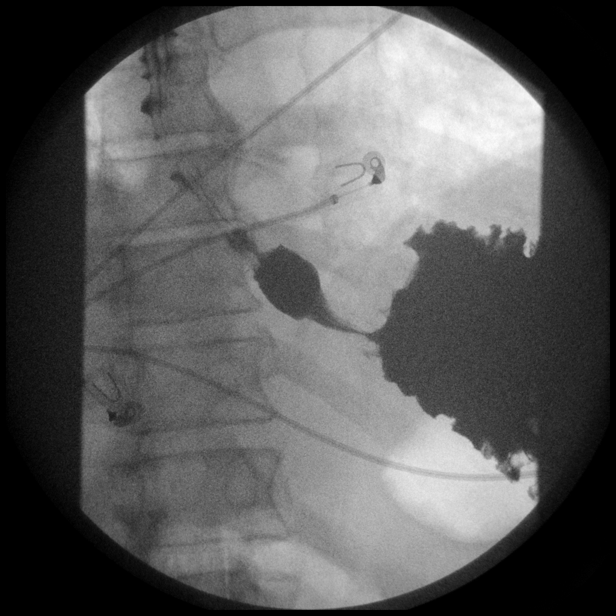

[Series 10: run · 1 of 1 slices shown (8 of 14)]
[im 1/1]
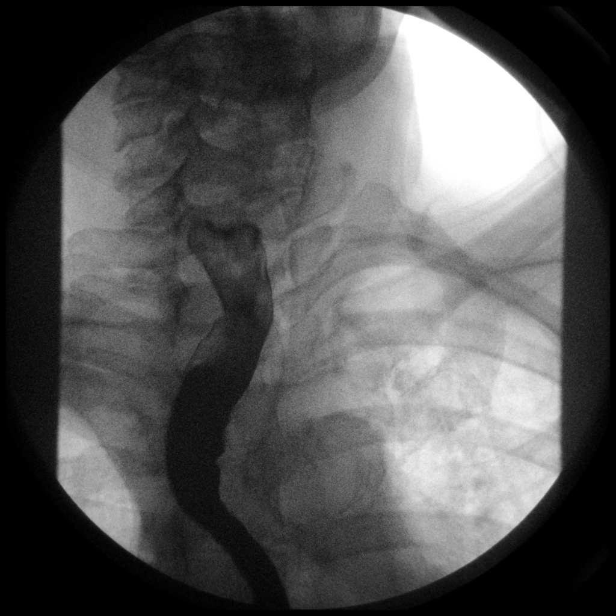

[Series 11: run · 1 of 1 slices shown (9 of 14)]
[im 1/1]
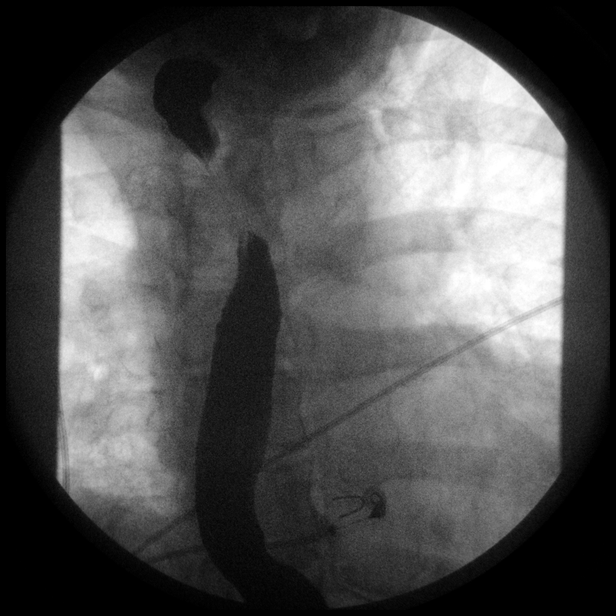

[Series 12: run · 1 of 1 slices shown (10 of 14)]
[im 1/1]
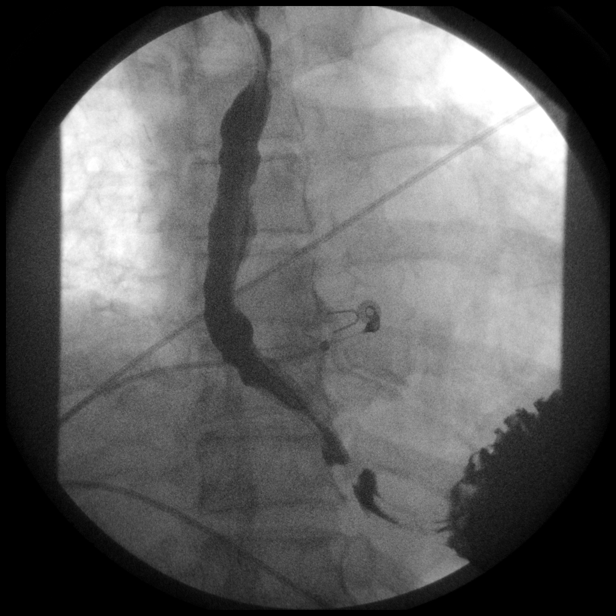

[Series 13: run · 1 of 1 slices shown (11 of 14)]
[im 1/1]
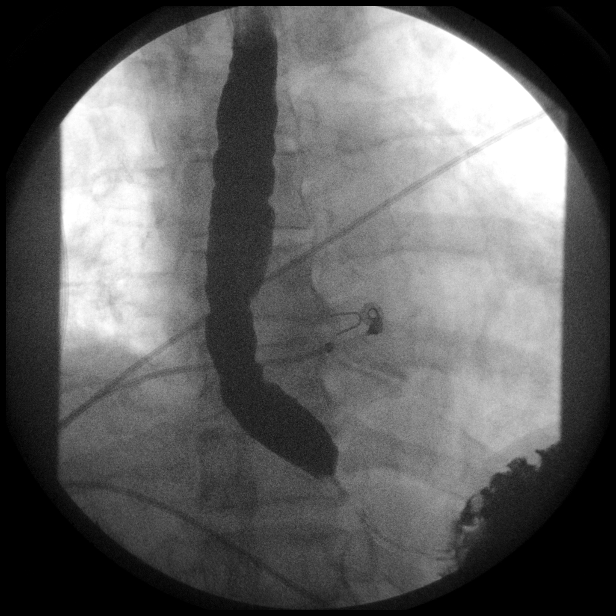

[Series 14: run · 1 of 1 slices shown (12 of 14)]
[im 1/1]
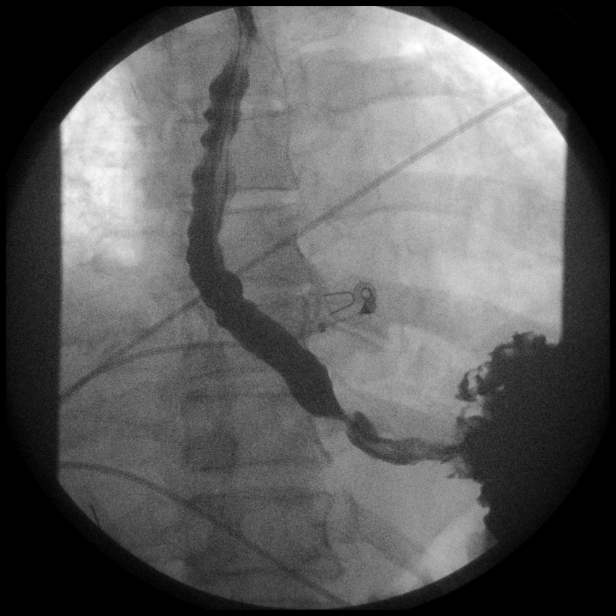

[Series 16: run · 1 of 1 slices shown (13 of 14)]
[im 1/1]
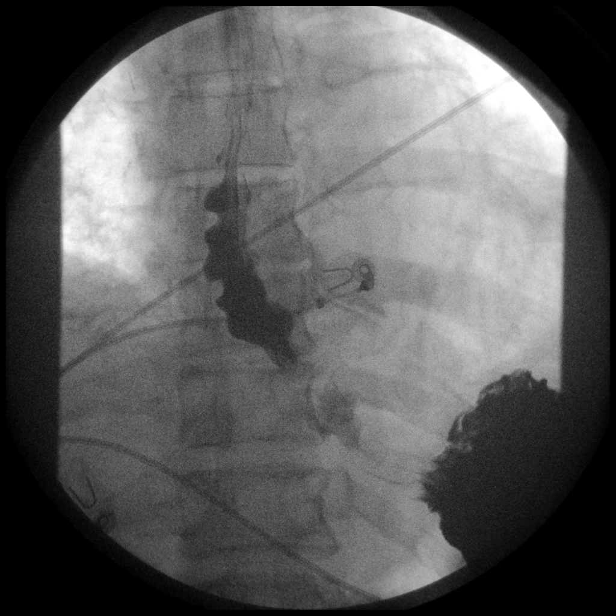

[Series 17: run · 1 of 1 slices shown (14 of 14)]
[im 1/1]
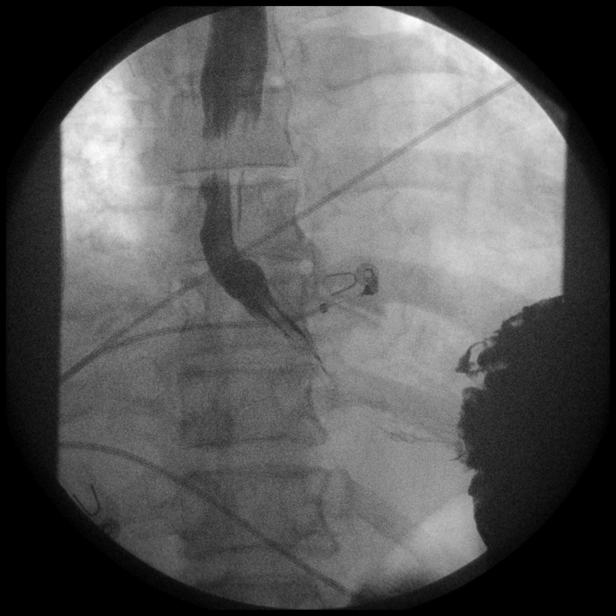

[14 of 17 positions shown; findings below may reference images not displayed]

FINDINGS: Esophageal dysmotility with disruption of the primary peristaltic
wave, frequent tertiary contractions, esophageal spasm and moderate
esophageal stasis.

No definite hiatal hernia or GE reflux.

Persistent area of smooth strictured narrowing just above the
esophageal vestibule. The 13 mm barium pill would not pass through
this area. Recommend endoscopic evaluation.
IMPRESSION: Area of smooth strictured narrowing just above the esophageal
vestibule. The 13 mm barium pill would not pass through this area.

Esophageal dysmotility.

## 2019-01-21 IMAGING — DX DG CHEST 1V PORT
1 series · 1 of 1 positions shown · non-contrast
Comparison: August 27, 2017

CLINICAL DATA: Respiratory distress

EXAM:
PORTABLE CHEST 1 VIEW

[chest ap]
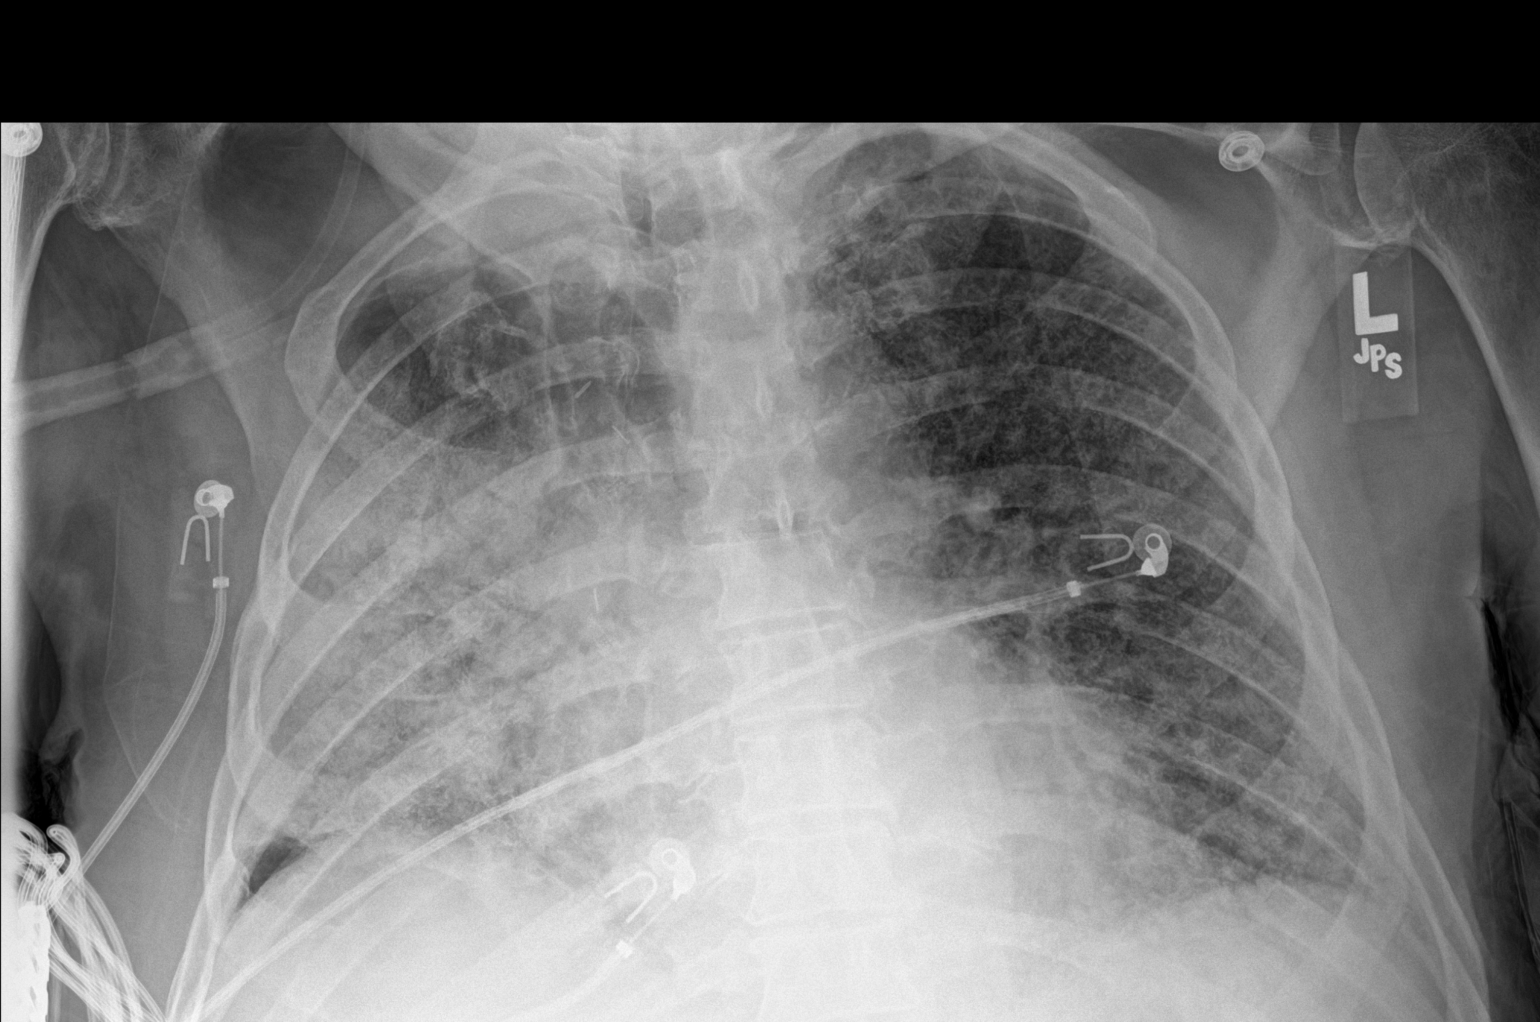

[1 of 1 positions shown; findings below may reference images not displayed]

FINDINGS: There is widespread consolidation throughout much of the right lung,
likely due to pneumonia. There is underlying fibrosis with
questionable superimposed interstitial edema. There is mild
cardiomegaly with pulmonary vascularity normal.

There are postoperative changes on the right.

There is stable opacity in the right apex region.
IMPRESSION: 1. Extensive airspace consolidation throughout much of the right
lung, likely widespread pneumonia.

2. Asymmetric opacity right upper lobe in the apex region. A mass in
this area cannot be excluded. Contrast enhanced chest CT advised to
further assess this area.

3. Underlying parenchymal fibrosis with questionable mild
superimposed interstitial edema.

4.  Areas of postoperative change on the right.

5.  Mild cardiomegaly.

These results will be called to the ordering clinician or
representative by the Radiologist Assistant, and communication
documented in the PACS or zVision Dashboard.
